# Patient Record
Sex: Male | Born: 1940 | Race: White | Hispanic: No | Marital: Married | Smoking: Never smoker
Health system: Southern US, Community
[De-identification: ages and names within clinical notes are randomized; demographics above are authoritative.]

## PROBLEM LIST (undated history)

## (undated) DIAGNOSIS — Z8719 Personal history of other diseases of the digestive system: Secondary | ICD-10-CM

## (undated) DIAGNOSIS — I10 Essential (primary) hypertension: Secondary | ICD-10-CM

## (undated) DIAGNOSIS — M533 Sacrococcygeal disorders, not elsewhere classified: Secondary | ICD-10-CM

## (undated) DIAGNOSIS — Z9889 Other specified postprocedural states: Secondary | ICD-10-CM

## (undated) DIAGNOSIS — E7800 Pure hypercholesterolemia, unspecified: Secondary | ICD-10-CM

## (undated) DIAGNOSIS — M1990 Unspecified osteoarthritis, unspecified site: Secondary | ICD-10-CM

## (undated) DIAGNOSIS — E079 Disorder of thyroid, unspecified: Secondary | ICD-10-CM

## (undated) DIAGNOSIS — N289 Disorder of kidney and ureter, unspecified: Secondary | ICD-10-CM

## (undated) DIAGNOSIS — I493 Ventricular premature depolarization: Secondary | ICD-10-CM

## (undated) DIAGNOSIS — E039 Hypothyroidism, unspecified: Secondary | ICD-10-CM

## (undated) DIAGNOSIS — Z951 Presence of aortocoronary bypass graft: Secondary | ICD-10-CM

## (undated) DIAGNOSIS — K219 Gastro-esophageal reflux disease without esophagitis: Secondary | ICD-10-CM

## (undated) DIAGNOSIS — L570 Actinic keratosis: Secondary | ICD-10-CM

## (undated) HISTORY — PX: CARDIAC SURGERY: SHX584

## (undated) HISTORY — PX: COLON SURGERY: SHX602

## (undated) HISTORY — DX: Sacrococcygeal disorders, not elsewhere classified: M53.3

## (undated) HISTORY — PX: CORONARY ARTERY BYPASS GRAFT: SHX141

## (undated) HISTORY — DX: Actinic keratosis: L57.0

## (undated) HISTORY — PX: APPENDECTOMY: SHX54

## (undated) HISTORY — PX: PROSTATE SURGERY: SHX751

---

## 2004-02-28 ENCOUNTER — Encounter: Payer: Self-pay | Admitting: Internal Medicine

## 2004-03-30 ENCOUNTER — Encounter: Payer: Self-pay | Admitting: Internal Medicine

## 2004-04-29 ENCOUNTER — Encounter: Payer: Self-pay | Admitting: Internal Medicine

## 2004-05-30 ENCOUNTER — Encounter: Payer: Self-pay | Admitting: Internal Medicine

## 2004-06-30 ENCOUNTER — Encounter: Payer: Self-pay | Admitting: Internal Medicine

## 2004-07-28 ENCOUNTER — Encounter: Payer: Self-pay | Admitting: Internal Medicine

## 2004-08-28 ENCOUNTER — Encounter: Payer: Self-pay | Admitting: Internal Medicine

## 2004-09-27 ENCOUNTER — Encounter: Payer: Self-pay | Admitting: Internal Medicine

## 2004-10-28 ENCOUNTER — Encounter: Payer: Self-pay | Admitting: Internal Medicine

## 2004-11-27 ENCOUNTER — Encounter: Payer: Self-pay | Admitting: Internal Medicine

## 2004-12-28 ENCOUNTER — Encounter: Payer: Self-pay | Admitting: Internal Medicine

## 2005-01-28 ENCOUNTER — Encounter: Payer: Self-pay | Admitting: Internal Medicine

## 2005-02-27 ENCOUNTER — Encounter: Payer: Self-pay | Admitting: Internal Medicine

## 2005-03-30 ENCOUNTER — Encounter: Payer: Self-pay | Admitting: Internal Medicine

## 2005-04-29 ENCOUNTER — Encounter: Payer: Self-pay | Admitting: Internal Medicine

## 2005-05-30 ENCOUNTER — Encounter: Payer: Self-pay | Admitting: Internal Medicine

## 2005-06-30 ENCOUNTER — Encounter: Payer: Self-pay | Admitting: Internal Medicine

## 2005-07-28 ENCOUNTER — Encounter: Payer: Self-pay | Admitting: Internal Medicine

## 2005-08-28 ENCOUNTER — Encounter: Payer: Self-pay | Admitting: Internal Medicine

## 2005-09-27 ENCOUNTER — Encounter: Payer: Self-pay | Admitting: Internal Medicine

## 2005-10-28 ENCOUNTER — Encounter: Payer: Self-pay | Admitting: Internal Medicine

## 2005-11-27 ENCOUNTER — Encounter: Payer: Self-pay | Admitting: Internal Medicine

## 2005-12-28 ENCOUNTER — Encounter: Payer: Self-pay | Admitting: Internal Medicine

## 2006-01-28 ENCOUNTER — Encounter: Payer: Self-pay | Admitting: Internal Medicine

## 2006-02-27 ENCOUNTER — Encounter: Payer: Self-pay | Admitting: Internal Medicine

## 2006-03-30 ENCOUNTER — Encounter: Payer: Self-pay | Admitting: Internal Medicine

## 2006-04-29 ENCOUNTER — Encounter: Payer: Self-pay | Admitting: Internal Medicine

## 2006-05-05 ENCOUNTER — Ambulatory Visit: Payer: Self-pay | Admitting: Internal Medicine

## 2006-05-30 ENCOUNTER — Encounter: Payer: Self-pay | Admitting: Internal Medicine

## 2006-06-30 ENCOUNTER — Encounter: Payer: Self-pay | Admitting: Internal Medicine

## 2006-07-29 ENCOUNTER — Encounter: Payer: Self-pay | Admitting: Internal Medicine

## 2006-08-29 ENCOUNTER — Encounter: Payer: Self-pay | Admitting: Internal Medicine

## 2006-09-28 ENCOUNTER — Encounter: Payer: Self-pay | Admitting: Internal Medicine

## 2006-10-18 ENCOUNTER — Other Ambulatory Visit: Payer: Self-pay

## 2006-10-18 ENCOUNTER — Inpatient Hospital Stay: Payer: Self-pay | Admitting: Internal Medicine

## 2006-10-29 ENCOUNTER — Encounter: Payer: Self-pay | Admitting: Internal Medicine

## 2006-11-28 ENCOUNTER — Encounter: Payer: Self-pay | Admitting: Internal Medicine

## 2010-05-13 ENCOUNTER — Observation Stay: Payer: Self-pay | Admitting: Family Medicine

## 2012-03-27 ENCOUNTER — Ambulatory Visit: Payer: Self-pay | Admitting: Unknown Physician Specialty

## 2012-03-28 LAB — PATHOLOGY REPORT

## 2013-01-26 ENCOUNTER — Emergency Department: Payer: Self-pay | Admitting: Emergency Medicine

## 2013-01-26 LAB — URINALYSIS, COMPLETE
Bacteria: NONE SEEN
Bilirubin,UR: NEGATIVE
Blood: NEGATIVE
Glucose,UR: NEGATIVE mg/dL
Ketone: NEGATIVE
Leukocyte Esterase: NEGATIVE
Nitrite: NEGATIVE
Ph: 7
Protein: NEGATIVE
RBC,UR: <1 /HPF
Specific Gravity: 1.019
Squamous Epithelial: NONE SEEN
WBC UR: 2 /HPF

## 2013-01-26 LAB — CBC
HCT: 39.7 % — ABNORMAL LOW
HGB: 13.8 g/dL
MCH: 32.4 pg
MCHC: 34.7 g/dL
MCV: 93 fL
Platelet: 195 x10 3/mm 3
RBC: 4.26 x10 6/mm 3 — ABNORMAL LOW
RDW: 13.4 %
WBC: 10.4 x10 3/mm 3

## 2013-01-26 LAB — TROPONIN I: Troponin-I: <0.02 ng/mL

## 2013-01-26 LAB — COMPREHENSIVE METABOLIC PANEL
Alkaline Phosphatase: 78 U/L (ref 50–136)
Anion Gap: 6 — ABNORMAL LOW (ref 7–16)
BUN: 30 mg/dL — ABNORMAL HIGH (ref 7–18)
Calcium, Total: 9.3 mg/dL (ref 8.5–10.1)
Chloride: 103 mmol/L (ref 98–107)
Creatinine: 1.52 mg/dL — ABNORMAL HIGH (ref 0.60–1.30)
EGFR (African American): 53 — ABNORMAL LOW
Osmolality: 276 (ref 275–301)
SGOT(AST): 24 U/L (ref 15–37)
SGPT (ALT): 32 U/L (ref 12–78)

## 2013-05-30 DIAGNOSIS — C801 Malignant (primary) neoplasm, unspecified: Secondary | ICD-10-CM

## 2013-05-30 HISTORY — PX: HERNIA REPAIR: SHX51

## 2013-05-30 HISTORY — DX: Malignant (primary) neoplasm, unspecified: C80.1

## 2013-07-17 ENCOUNTER — Ambulatory Visit: Payer: Self-pay | Admitting: Surgery

## 2013-07-23 ENCOUNTER — Ambulatory Visit: Payer: Self-pay | Admitting: Surgery

## 2013-10-11 DIAGNOSIS — E039 Hypothyroidism, unspecified: Secondary | ICD-10-CM | POA: Insufficient documentation

## 2013-10-11 DIAGNOSIS — C61 Malignant neoplasm of prostate: Secondary | ICD-10-CM | POA: Insufficient documentation

## 2013-10-11 DIAGNOSIS — I2510 Atherosclerotic heart disease of native coronary artery without angina pectoris: Secondary | ICD-10-CM | POA: Insufficient documentation

## 2013-10-16 ENCOUNTER — Ambulatory Visit: Payer: Self-pay | Admitting: Internal Medicine

## 2013-10-28 ENCOUNTER — Ambulatory Visit: Payer: Self-pay | Admitting: Internal Medicine

## 2013-10-28 HISTORY — PX: PROSTATE SURGERY: SHX751

## 2014-03-21 DIAGNOSIS — I340 Nonrheumatic mitral (valve) insufficiency: Secondary | ICD-10-CM | POA: Insufficient documentation

## 2014-03-28 DIAGNOSIS — R739 Hyperglycemia, unspecified: Secondary | ICD-10-CM | POA: Insufficient documentation

## 2014-04-23 ENCOUNTER — Emergency Department: Payer: Self-pay | Admitting: Emergency Medicine

## 2014-05-05 ENCOUNTER — Ambulatory Visit: Payer: Self-pay | Admitting: Internal Medicine

## 2014-09-20 NOTE — Op Note (Signed)
PATIENT NAME:  Douglas Phelps, Douglas Phelps MR#:  [id number] DATE OF BIRTH:  [birth year]  DATE OF PROCEDURE:  07/23/2013  PREOPERATIVE DIAGNOSIS: Right inguinal hernia.   POSTOPERATIVE DIAGNOSIS:  Right inguinal hernia.  PROCEDURE: Right inguinal hernia repair.   SURGEON: Rochel Brome, M.D.   ANESTHESIA: General.   INDICATIONS: This 74 year old male has had bulging in the right groin. A hernia was demonstrated on physical exam and repair was recommended for definitive treatment.   DESCRIPTION OF PROCEDURE: The patient was placed on the operating table in the supine position under general anesthesia. The left lower quadrant and groin were prepared with clippers and with ChloraPrep, draped in a sterile manner. A right lower quadrant transversely oriented suprapubic incision was made, carried down through subcutaneous tissues. Several traversing small vessels were cauterized and divided. The Scarpa's fascia was incised. The external ring was identified. The external oblique aponeurosis was incised along the course of its fibers to open the external ring and expose the inguinal cord structures. The cord structures were mobilized along with the ilioinguinal nerve. Cremaster fibers were spread. There was no indirect sac; however, there was a large direct inguinal hernia and the defect extended from the pubic tubercle to the internal ring. A circumferential incision was made in the attenuated transversalis fascia, which was resected. Next, the sac, which was approximately 4 cm in length, was inverted. Next, a repair was carried out with a row of 0 Surgilon sutures, suturing the shelving edge of the inguinal ligament to the conjoined tendon, incorporating transversalis fascia into the repair. The last stitch led to satisfactory narrowing of the internal ring. Next, a relaxing incision was made medially in the internal oblique fascia. Next, an onlay Bard soft mesh was cut to create an oval shape of some 2.8 x 3.5 cm and  with a notch cut out to straddle the internal ring. This was placed along the floor of the inguinal canal and was sutured laterally to the repair with 0 Surgilon, and also was sutured medially to the medial aspect of the relaxing incision and also sutures were used to hold the mesh adjacent to the internal ring. The repair looked good. Hemostasis was intact. The ilioinguinal nerve and cord structures were replaced along the floor of the inguinal canal. Cut edges of the external oblique aponeurosis were approximated with running 4-0 Vicryl. The deep fascia superior and lateral to the repair site was infiltrated with 0.5% Sensorcaine with epinephrine. Subcutaneous tissues were infiltrated as well for a total of 20 mL. Next, the Scarpa's fascia was closed with interrupted 4-0 Vicryl, and the skin was closed with running 4-0 Monocryl subcuticular suture and Dermabond. The testicle remained in the scrotum. The patient tolerated the procedure satisfactorily and was then prepared for transfer to the recovery room.     ____________________________ Lenna Sciara. Rochel Brome, MD jws:dmm D: 07/23/2013 09:22:28 ET T: 07/23/2013 10:13:15 ET JOB#: [id number]  cc: Loreli Dollar, MD, <Dictator> Loreli Dollar MD ELECTRONICALLY SIGNED 07/24/2013 16:16

## 2015-07-30 DIAGNOSIS — N183 Chronic kidney disease, stage 3 unspecified: Secondary | ICD-10-CM | POA: Insufficient documentation

## 2015-11-18 ENCOUNTER — Other Ambulatory Visit: Payer: Self-pay | Admitting: Internal Medicine

## 2015-11-18 DIAGNOSIS — N179 Acute kidney failure, unspecified: Secondary | ICD-10-CM

## 2015-11-26 ENCOUNTER — Ambulatory Visit
Admission: RE | Admit: 2015-11-26 | Discharge: 2015-11-26 | Disposition: A | Discharge status: Home / Self Care | Payer: Medicare Other | Source: Ambulatory Visit | Attending: Internal Medicine | Admitting: Internal Medicine

## 2015-11-26 DIAGNOSIS — N2889 Other specified disorders of kidney and ureter: Secondary | ICD-10-CM | POA: Insufficient documentation

## 2015-11-26 DIAGNOSIS — N179 Acute kidney failure, unspecified: Secondary | ICD-10-CM

## 2016-02-06 ENCOUNTER — Emergency Department: Payer: Medicare Other

## 2016-02-06 ENCOUNTER — Emergency Department
Admission: EM | Admit: 2016-02-06 | Discharge: 2016-02-06 | Disposition: A | Discharge status: Home / Self Care | Payer: Medicare Other | Attending: Emergency Medicine | Admitting: Emergency Medicine

## 2016-02-06 ENCOUNTER — Encounter: Payer: Self-pay | Admitting: Emergency Medicine

## 2016-02-06 DIAGNOSIS — R000 Tachycardia, unspecified: Secondary | ICD-10-CM | POA: Diagnosis not present

## 2016-02-06 DIAGNOSIS — R079 Chest pain, unspecified: Secondary | ICD-10-CM

## 2016-02-06 DIAGNOSIS — R002 Palpitations: Secondary | ICD-10-CM

## 2016-02-06 DIAGNOSIS — E871 Hypo-osmolality and hyponatremia: Secondary | ICD-10-CM | POA: Diagnosis not present

## 2016-02-06 DIAGNOSIS — I10 Essential (primary) hypertension: Secondary | ICD-10-CM | POA: Diagnosis not present

## 2016-02-06 HISTORY — DX: Disorder of thyroid, unspecified: E07.9

## 2016-02-06 HISTORY — DX: Disorder of kidney and ureter, unspecified: N28.9

## 2016-02-06 HISTORY — DX: Presence of aortocoronary bypass graft: Z95.1

## 2016-02-06 HISTORY — DX: Pure hypercholesterolemia, unspecified: E78.00

## 2016-02-06 HISTORY — DX: Essential (primary) hypertension: I10

## 2016-02-06 HISTORY — DX: Gastro-esophageal reflux disease without esophagitis: K21.9

## 2016-02-06 LAB — BASIC METABOLIC PANEL
ANION GAP: 8 (ref 5–15)
BUN: 17 mg/dL (ref 6–20)
CALCIUM: 9.2 mg/dL (ref 8.9–10.3)
CHLORIDE: 100 mmol/L — AB (ref 101–111)
CO2: 23 mmol/L (ref 22–32)
Creatinine, Ser: 1.36 mg/dL — ABNORMAL HIGH (ref 0.61–1.24)
GFR calc non Af Amer: 50 mL/min — ABNORMAL LOW (ref >60)
GFR, EST AFRICAN AMERICAN: 58 mL/min — AB (ref >60)
Glucose, Bld: 158 mg/dL — ABNORMAL HIGH (ref 65–99)
Potassium: 4.4 mmol/L (ref 3.5–5.1)
Sodium: 131 mmol/L — ABNORMAL LOW (ref 135–145)

## 2016-02-06 LAB — CBC
HCT: 42.5 % (ref 40.0–52.0)
HEMOGLOBIN: 15 g/dL (ref 13.0–18.0)
MCH: 33.1 pg (ref 26.0–34.0)
MCHC: 35.4 g/dL (ref 32.0–36.0)
MCV: 93.6 fL (ref 80.0–100.0)
Platelets: 194 10*3/uL (ref 150–440)
RBC: 4.54 MIL/uL (ref 4.40–5.90)
RDW: 12.9 % (ref 11.5–14.5)
WBC: 7.9 10*3/uL (ref 3.8–10.6)

## 2016-02-06 LAB — TROPONIN I: Troponin I: <0.03 ng/mL (ref <0.03)

## 2016-02-06 MED ORDER — ISOSORBIDE MONONITRATE ER 30 MG PO TB24: 30.0000 mg | ORAL_TABLET | Freq: Every day | ORAL | 0 refills | Status: DC

## 2016-02-06 MED ORDER — ASPIRIN EC 325 MG PO TBEC
325.0000 mg | DELAYED_RELEASE_TABLET | Freq: Once | ORAL | Status: AC
  Administered 2016-02-06: 325 mg via ORAL
  Filled 2016-02-06: qty 1

## 2016-02-06 NOTE — ED Provider Notes (Addendum)
[facility] Emergency Department Provider Note  ____________________________________________  Time seen: Approximately 3:49 PM  I have reviewed the triage vital signs and the nursing notes.   HISTORY  Chief Complaint Chest Pain    HPI Douglas Phelps is a 75 y.o. male w/ a hx of CAD s/p CABG remotely, HTN, HL, CKI, GERD, presenting with chest pain. The patient reports that for the past month he has had several episodes weekly of the chest "discomfort" that is central and dull. It is not associated with shortness of breath, back pain, lightheadedness or syncope, palpitations. It is worse when he moves his shoulders around and has lateral positional changes, but he denies any recent changes in his daily activities. It is not associated with exertion, and he regularly exercises with heart rates in the 110s to 120s without chest discomfort. It is not associated with food. Last week, he went for routine examination with his cardiologist and discuss his chest pain, no stress test is scheduled for 02/18/16. Today, the patient had just finished eating lunch at [employer] he was walking out to the garden when he developed the same chest discomfort he has been having, but this time it was also associated with a "foggy" feeling and a "pounding" of his chest. The symptoms lasted for longer than usual, but have almost completely resolved at this time.  The only 2 things that are notable recently, so the patient has increased his water intake significantly due to his CK ED and is drinking at least 96 ounces of water daily, and then he recently was switched from Zocor to Lipitor taking his first dose last night.   Past Medical History:  Diagnosis Date  . GERD (gastroesophageal reflux disease)   . High cholesterol   . Hx of CABG   . Hypertension   . Renal disorder   . Thyroid disease     There are no active problems to display for this patient.   History reviewed. No pertinent  surgical history.    Allergies Benadryl [diphenhydramine hcl (sleep)] and Lotensin [benazepril]  History reviewed. No pertinent family history.  Social History Social History  Substance Use Topics  . Smoking status: Never Smoker  . Smokeless tobacco: Never Used  . Alcohol use No    Review of Systems Constitutional: No fever/chills.No lightheadedness or syncope. No diaphoresis. Eyes: No visual changes. ENT: No sore throat. No congestion or rhinorrhea. Cardiovascular: Positive chest pain. Denies palpitations. Respiratory: Denies shortness of breath.  No cough. Gastrointestinal: No abdominal pain.  No nausea, no vomiting.  No diarrhea.  No constipation. Genitourinary: Negative for dysuria. Musculoskeletal: Negative for back pain. Skin: Negative for rash. Neurological: Negative for headaches. No focal numbness, tingling or weakness.   10-point ROS otherwise negative.  ____________________________________________   PHYSICAL EXAM:  VITAL SIGNS: ED Triage Vitals  Enc Vitals Group     BP 02/06/16 1355 (!) 183/54     Pulse Rate 02/06/16 1355 79     Resp 02/06/16 1355 18     Temp 02/06/16 1355 98.5 F (36.9 C)     Temp Source 02/06/16 1355 Oral     SpO2 02/06/16 1355 100 %     Weight 02/06/16 1356 167 lb (75.8 kg)     Height 02/06/16 1356 5\' 6"  (1.676 m)     Head Circumference --      Peak Flow --      Pain Score 02/06/16 1356 1     Pain Loc --  Pain Edu? --      Excl. in [place]? --     Constitutional: Alert and oriented. Well appearing and in no acute distress. Answers questions appropriately. Eyes: Conjunctivae are normal.  EOMI. No scleral icterus. Head: Atraumatic. Nose: No congestion/rhinnorhea. Mouth/Throat: Mucous membranes are moist.  Neck: No stridor.  Supple.   Cardiovascular: Normal rate, regular rhythm. No murmurs, rubs or gallops.  Respiratory: Normal respiratory effort.  No accessory muscle use or retractions. Lungs CTAB.  No wheezes, rales or  ronchi. Gastrointestinal: Soft, nontender and nondistended.  No guarding or rebound.  No peritoneal signs. Musculoskeletal: No LE edema. No ttp in the calves or palpable cords.  Negative Homan's sign. Neurologic:  A&Ox3.  Speech is clear.  Face and smile are symmetric.  EOMI.  Moves all extremities well. Skin:  Skin is warm, dry and intact. No rash noted. Psychiatric: Mood and affect are normal. Speech and behavior are normal.  Normal judgement.  ____________________________________________   LABS (all labs ordered are listed, but only abnormal results are displayed)  Labs Reviewed  BASIC METABOLIC PANEL - Abnormal; Notable for the following:       Result Value   Sodium 131 (*)    Chloride 100 (*)    Glucose, Bld 158 (*)    Creatinine, Ser 1.36 (*)    GFR calc non Af Amer 50 (*)    GFR calc Af Amer 58 (*)    All other components within normal limits  CBC  TROPONIN I  TROPONIN I   ____________________________________________  EKG  ED ECG REPORT I, Eula Listen, the attending physician, personally viewed and interpreted this ECG.   Date: 02/06/2016  EKG Time: 1354  Rate: 76  Rhythm: normal sinus rhythm  Axis: Normal  Intervals:none  ST&T Change: Nonspecific T-wave inversion in V1. No STEMI  ____________________________________________  RADIOLOGY  Dg Chest 2 View  Result Date: 02/06/2016 CLINICAL DATA:  Patient with chest pain. EXAM: CHEST  2 VIEW COMPARISON:  Chest radiograph 05/13/2010 FINDINGS: Normal cardiac and mediastinal contours status post median sternotomy. No consolidative pulmonary opacities. No pleural effusion or pneumothorax. Biapical pleural parenchymal thickening. Aortic vascular calcifications. Mid thoracic spine degenerative changes. IMPRESSION: No acute cardiopulmonary process. Aortic vascular calcifications. Electronically Signed   By: Lovey Newcomer M.D.   On: 02/06/2016 14:23     ____________________________________________   PROCEDURES  Procedure(s) performed: None  Procedures  Critical Care performed: No ____________________________________________   INITIAL IMPRESSION / ASSESSMENT AND PLAN / ED COURSE  Pertinent labs & imaging results that were available during my care of the patient were reviewed by me and considered in my medical decision making (see chart for details).  75 y.o. male with a history of CAD status post CABG presenting with 1 month of intermittent chest pain, today is associated with foggy headed sensation and a chest pounding. At this time, the patient has stable vital signs and a reassuring cardiopulmonary examination. His workup in the emergency department does not show any ischemic changes on his EKG, chest x-ray with no acute cardio pulmonary process, and a negative troponin. His troponin was drawn within 2 hours of his chest pain episode, also get a second draw at 5 PM. I do not see any clinical evidence that would be suggestive of PE, there is no history or physical evidence for aortic pathology. He has no infectious process today. GI causes, including esophageal spasm or GERD, are possible. If his workup in the emergency department is negative, we will plan to  talk to his cardiologist, Dr. Nehemiah Massed, to see fluid be able to move up his stress test.  ----------------------------------------- 6:01 PM on 02/06/2016 -----------------------------------------  The patient has a second troponin which is negative, and a second EKG which is grossly unchanged.  ED ECG REPORT I, Eula Listen, the attending physician, personally viewed and interpreted this ECG.   Date: 02/06/2016  EKG Time: 1621  Rate: 61  Rhythm: normal sinus rhythm  Axis: normal  Intervals:none  ST&T Change: Nonspecific T-wave inversions in V1. No ST elevation.  Unchanged from previous.   I've spoken with the patient's cardiologist, Dr. Nehemiah Massed, who  recommends starting a nitrate, Imdur 30 mg daily which will help him make a recommendation about cardio catheterization. In the meantime, I will plan to discharge the patient home, and have him record his symptoms. He understands return precautions as well as follow-up instructions. ____________________________________________  FINAL CLINICAL IMPRESSION(S) / ED DIAGNOSES  Final diagnoses:  Nonspecific chest pain  Hyponatremia  Pounding heartbeat       NEW MEDICATIONS STARTED DURING THIS VISIT:  New Prescriptions   ISOSORBIDE MONONITRATE (IMDUR) 30 MG 24 HR TABLET    Take 1 tablet (30 mg total) by mouth daily.      Eula Listen, MD 02/06/16 [id number]    Eula Listen, MD 02/06/16 [id number]

## 2016-02-06 NOTE — Discharge Instructions (Addendum)
Please start taking Imdur daily, and record your symptoms which may help Dr. Gigi Gin make further medical decisions.  Return to the emergency department if you develop worsening or severe chest pain, shortness of breath, lightheadedness or fainting, palpitations, fever, or any other symptoms concerning to you.

## 2016-02-06 NOTE — ED Triage Notes (Signed)
Pt to ed with c/o chest pain this am.  Pt states was seen by cardiologist 2 days ago for same.  Pt states scheduled for stress test.  Pt denies sob.  Denies n/v.

## 2016-03-24 ENCOUNTER — Other Ambulatory Visit: Payer: Self-pay | Admitting: Internal Medicine

## 2016-03-24 DIAGNOSIS — R0789 Other chest pain: Secondary | ICD-10-CM

## 2016-03-29 ENCOUNTER — Ambulatory Visit
Admission: RE | Admit: 2016-03-29 | Discharge: 2016-03-29 | Disposition: A | Discharge status: Home / Self Care | Payer: Medicare Other | Source: Ambulatory Visit | Attending: Internal Medicine | Admitting: Internal Medicine

## 2016-03-29 DIAGNOSIS — K449 Diaphragmatic hernia without obstruction or gangrene: Secondary | ICD-10-CM | POA: Diagnosis not present

## 2016-03-29 DIAGNOSIS — K219 Gastro-esophageal reflux disease without esophagitis: Secondary | ICD-10-CM | POA: Diagnosis not present

## 2016-03-29 DIAGNOSIS — R0789 Other chest pain: Secondary | ICD-10-CM | POA: Diagnosis present

## 2016-05-03 DIAGNOSIS — R933 Abnormal findings on diagnostic imaging of other parts of digestive tract: Secondary | ICD-10-CM | POA: Insufficient documentation

## 2016-05-03 DIAGNOSIS — K219 Gastro-esophageal reflux disease without esophagitis: Secondary | ICD-10-CM | POA: Insufficient documentation

## 2016-06-20 ENCOUNTER — Encounter: Payer: Self-pay | Admitting: Emergency Medicine

## 2016-06-20 ENCOUNTER — Emergency Department
Admission: EM | Admit: 2016-06-20 | Discharge: 2016-06-20 | Disposition: A | Discharge status: Home / Self Care | Payer: Medicare Other | Attending: Emergency Medicine | Admitting: Emergency Medicine

## 2016-06-20 DIAGNOSIS — I10 Essential (primary) hypertension: Secondary | ICD-10-CM | POA: Diagnosis not present

## 2016-06-20 DIAGNOSIS — R42 Dizziness and giddiness: Secondary | ICD-10-CM | POA: Insufficient documentation

## 2016-06-20 LAB — BASIC METABOLIC PANEL
ANION GAP: 8 (ref 5–15)
BUN: 23 mg/dL — ABNORMAL HIGH (ref 6–20)
CALCIUM: 9.6 mg/dL (ref 8.9–10.3)
CHLORIDE: 92 mmol/L — AB (ref 101–111)
CO2: 25 mmol/L (ref 22–32)
Creatinine, Ser: 1.38 mg/dL — ABNORMAL HIGH (ref 0.61–1.24)
GFR calc non Af Amer: 49 mL/min — ABNORMAL LOW (ref >60)
GFR, EST AFRICAN AMERICAN: 57 mL/min — AB (ref >60)
Glucose, Bld: 105 mg/dL — ABNORMAL HIGH (ref 65–99)
Potassium: 4.7 mmol/L (ref 3.5–5.1)
SODIUM: 125 mmol/L — AB (ref 135–145)

## 2016-06-20 LAB — HEPATIC FUNCTION PANEL
ALBUMIN: 4.9 g/dL (ref 3.5–5.0)
ALT: 20 U/L (ref 17–63)
AST: 27 U/L (ref 15–41)
Alkaline Phosphatase: 57 U/L (ref 38–126)
Bilirubin, Direct: <0.1 mg/dL — ABNORMAL LOW (ref 0.1–0.5)
Total Bilirubin: 0.6 mg/dL (ref 0.3–1.2)
Total Protein: 8 g/dL (ref 6.5–8.1)

## 2016-06-20 LAB — CBC
HCT: 43 % (ref 40.0–52.0)
HEMOGLOBIN: 14.9 g/dL (ref 13.0–18.0)
MCH: 31.6 pg (ref 26.0–34.0)
MCHC: 34.6 g/dL (ref 32.0–36.0)
MCV: 91.4 fL (ref 80.0–100.0)
Platelets: 211 10*3/uL (ref 150–440)
RBC: 4.71 MIL/uL (ref 4.40–5.90)
RDW: 12.8 % (ref 11.5–14.5)
WBC: 8.6 10*3/uL (ref 3.8–10.6)

## 2016-06-20 LAB — TROPONIN I

## 2016-06-20 MED ORDER — ONDANSETRON HCL 4 MG/2ML IJ SOLN
4.0000 mg | Freq: Once | INTRAMUSCULAR | Status: AC
  Administered 2016-06-20: 4 mg via INTRAVENOUS
  Filled 2016-06-20: qty 2

## 2016-06-20 MED ORDER — ACETAMINOPHEN 325 MG PO TABS
ORAL_TABLET | ORAL | Status: AC
  Administered 2016-06-20: 650 mg via ORAL
  Filled 2016-06-20: qty 2

## 2016-06-20 MED ORDER — CLONIDINE HCL 0.1 MG PO TABS: 0.1000 mg | ORAL_TABLET | Freq: Two times a day (BID) | ORAL | 0 refills | Status: DC | PRN

## 2016-06-20 MED ORDER — HYDROMORPHONE HCL 1 MG/ML IJ SOLN
0.5000 mg | Freq: Once | INTRAMUSCULAR | Status: AC
  Administered 2016-06-20: 0.5 mg via INTRAVENOUS
  Filled 2016-06-20: qty 1

## 2016-06-20 MED ORDER — SODIUM CHLORIDE 0.9 % IV BOLUS (SEPSIS)
500.0000 mL | Freq: Once | INTRAVENOUS | Status: AC
  Administered 2016-06-20: 500 mL via INTRAVENOUS

## 2016-06-20 MED ORDER — ACETAMINOPHEN 325 MG PO TABS
650.0000 mg | ORAL_TABLET | ORAL | Status: AC
  Administered 2016-06-20: 650 mg via ORAL

## 2016-06-20 NOTE — ED Notes (Signed)
Pt states feeling dizzy, jittery, and headache, states his BP has been elevated of recent and states he was started on HCTZ 25 mg last week, at present pt awake and alert in no acute distress, wife at bedside

## 2016-06-20 NOTE — ED Provider Notes (Signed)
Time Seen: Approximately 1532 I have reviewed the triage notes  Chief Complaint: Dizziness   History of Present Illness: Douglas Phelps is a 76 y.o. male *who states he's had some issues with labile blood pressure at home. Patient had contacted the on call physician and explained his blood pressure was running high at home and he was prescribed hydrochlorothiazide on Saturday. The patient states he's been on the medication and was going to follow up with his primary physician and no due to the weather his appointment was delayed to this Wednesday. He states he's been experiencing some intermittent feelings of lightheadedness headaches and feeling "" jittery "". He denies any focal weakness and states he tried exercise today just didn't feel well. Diffuse crampiness in his lower extremities and up also into room the abdomen. Denies any chest pain or shortness of breath. He denies any trouble with speech or swallowing. Headache he states he's had before in the past when his blood pressure becomes labile describes it as a posterior headache that comes and goes. Denies any photophobia, loss of vision, neck pain or stiffness. No fever or recent head trauma at home.   Past Medical History:  Diagnosis Date  . GERD (gastroesophageal reflux disease)   . High cholesterol   . Hx of CABG   . Hypertension   . Renal disorder   . Thyroid disease     There are no active problems to display for this patient.   Past Surgical History:  Procedure Laterality Date  . CARDIAC SURGERY     CABG x 3  06/2000    Past Surgical History:  Procedure Laterality Date  . CARDIAC SURGERY     CABG x 3  06/2000    Current Outpatient Rx  . Order #: [id number] Class: Print  . Order #: [id number] Class: Print    Allergies:  Benadryl [diphenhydramine hcl (sleep)]; Lotensin [benazepril]; and Prilosec [omeprazole]  Family History: No family history on file.  Social History: Social History  Substance Use Topics   . Smoking status: Never Smoker  . Smokeless tobacco: Never Used  . Alcohol use No     Review of Systems:   10 point review of systems was performed and was otherwise negative:  Constitutional: No fever Eyes: No visual disturbances ENT: No sore throat, ear pain Cardiac: No chest pain Respiratory: No shortness of breath, wheezing, or stridor Abdomen: He denies any current abdominal pain to this historian. No associated nausea, vomiting or diarrhea. Endocrine: No weight loss, No night sweats Extremities: No peripheral edema, cyanosis Skin: No rashes, easy bruising Neurologic: No focal weakness, trouble with speech or swollowing Urologic: No dysuria, Hematuria, or urinary frequency   Physical Exam:  ED Triage Vitals  Enc Vitals Group     BP 06/20/16 1153 (!) 156/72     Pulse Rate 06/20/16 1153 72     Resp 06/20/16 1153 16     Temp 06/20/16 1153 98 F (36.7 C)     Temp Source 06/20/16 1153 Oral     SpO2 06/20/16 1153 98 %     Weight 06/20/16 1153 166 lb (75.3 kg)     Height 06/20/16 1153 5\' 6"  (1.676 m)     Head Circumference --      Peak Flow --      Pain Score 06/20/16 1159 0     Pain Loc --      Pain Edu? --      Excl. in [place]? --  General: Awake , Alert , and Oriented times 3; GCS 15 Head: Normal cephalic , atraumatic. No reproducible nature to the headache Eyes: Pupils equal , round, reactive to light Nose/Throat: No nasal drainage, patent upper airway without erythema or exudate.  Neck: Supple, Full range of motion, No anterior adenopathy or palpable thyroid masses. No meningeal signs Lungs: Clear to ascultation without wheezes , rhonchi, or rales Heart: Regular rate, regular rhythm without murmurs , gallops , or rubs Abdomen: Soft, non tender without rebound, guarding , or rigidity; bowel sounds positive and symmetric in all 4 quadrants. No organomegaly .        Extremities: 2 plus symmetric pulses. No edema, clubbing or cyanosis Neurologic: normal  ambulation, Motor symmetric without deficits, sensory intact Skin: warm, dry, no rashes   Labs:   All laboratory work was reviewed including any pertinent negatives or positives listed below:  Labs Reviewed  BASIC METABOLIC PANEL - Abnormal; Notable for the following:       Result Value   Sodium 125 (*)    Chloride 92 (*)    Glucose, Bld 105 (*)    BUN 23 (*)    Creatinine, Ser 1.38 (*)    GFR calc non Af Amer 49 (*)    GFR calc Af Amer 57 (*)    All other components within normal limits  HEPATIC FUNCTION PANEL - Abnormal; Notable for the following:    Bilirubin, Direct <0.1 (*)    All other components within normal limits  CBC  TROPONIN I  CBG MONITORING, ED    EKG: ED ECG REPORT I, Daymon Larsen, the attending physician, personally viewed and interpreted this ECG.  Date: 06/20/2016 EKG Time: 1222 Rate: 64 Rhythm: normal sinus rhythm QRS Axis: normal Intervals: Incomplete right bundle branch block ST/T Wave abnormalities: normal Conduction Disturbances: none Narrative Interpretation: unremarkable Left atrial enlargement No acute ischemic changes    ED Course: * Patient's stay here was uneventful and he was given a 500 mL bolus of normal saline. I felt he did not require head CT evaluation and investigation of his headaches and has had similar headaches before in the past. He has no focal neurologic deficits and no meningeal signs, etc. The patient was advised to stop the hydrochlorothiazide and presents with some hyponatremia. He has an appointment upcoming with his primary physician was given copies of his labs for home usage. He has a history of renal insufficiency. The patient was advised drink plenty of fluids and increase the salt in his diet. I felt his symptoms were exclusively to hyponatremia but could explain his feelings of lightheadedness along with his cramps etc. Patient was prescribed clonidine for as needed and hypertension advised continue with all of  his other medications and allow Dr. Doy Hutching to make adjustments as necessary.     Assessment:  Near syncope Hyponatremia   Final Clinical Impression:  Final diagnoses:  Dizziness     Plan:  Outpatient " New Prescriptions   CLONIDINE (CATAPRES) 0.1 MG TABLET    Take 1 tablet (0.1 mg total) by mouth 2 (two) times daily as needed.  " Patient was advised to return immediately if condition worsens. Patient was advised to follow up with their primary care physician or other specialized physicians involved in their outpatient care. The patient and/or family member/power of attorney had laboratory results reviewed at the bedside. All questions and concerns were addressed and appropriate discharge instructions were distributed by the nursing staff.  Daymon Larsen, MD 06/20/16 [phone]

## 2016-06-20 NOTE — Discharge Instructions (Signed)
Please hold the hydrochlorothiazide and continue with all of your other blood pressure medications. Please only take the clonidine as needed if your systolic pressures get above [id number] diastolic pressures greater than 100. Return to emergency department for chest pain, shortness of breath, increasing headache, weakness, or any other new concerns. Please drink plenty of fluids and increase the salt content for Next couple of days.  Please return immediately if condition worsens. Please contact her primary physician or the physician you were given for referral. If you have any specialist physicians involved in her treatment and plan please also contact them. Thank you for using  regional emergency Department.

## 2016-06-20 NOTE — ED Triage Notes (Signed)
Patient is a patient of Dr. Doy Hutching.  Started on HCTZ 25 mg QD on Saturday as an additional medication for blood pressure.  Patient has had a one week history of elevated BP, 186/84.  Also on Valsaarten, Metoprolol, Amlodipine.  Patient states he has been experiencing intermittent dizziness, headaches, and feeling jittery.  Patient states while at excersise today, just did not feel well and came to ED to be evaluated.  Also c/o pressure to Right upper abdomen under ribs that radiates around to upper back.

## 2016-06-22 ENCOUNTER — Other Ambulatory Visit: Payer: Self-pay | Admitting: Internal Medicine

## 2016-06-22 DIAGNOSIS — N183 Chronic kidney disease, stage 3 unspecified: Secondary | ICD-10-CM

## 2016-07-01 ENCOUNTER — Ambulatory Visit
Admission: RE | Admit: 2016-07-01 | Discharge: 2016-07-01 | Disposition: A | Discharge status: Home / Self Care | Payer: Medicare Other | Source: Ambulatory Visit | Attending: Internal Medicine | Admitting: Internal Medicine

## 2016-07-01 DIAGNOSIS — N183 Chronic kidney disease, stage 3 unspecified: Secondary | ICD-10-CM

## 2016-07-01 DIAGNOSIS — N289 Disorder of kidney and ureter, unspecified: Secondary | ICD-10-CM | POA: Diagnosis not present

## 2017-01-20 ENCOUNTER — Ambulatory Visit (INDEPENDENT_AMBULATORY_CARE_PROVIDER_SITE_OTHER): Payer: Medicare Other | Admitting: Vascular Surgery

## 2017-01-20 ENCOUNTER — Encounter: Payer: Self-pay | Admitting: *Deleted

## 2017-01-20 ENCOUNTER — Emergency Department: Payer: Medicare Other

## 2017-01-20 ENCOUNTER — Encounter (INDEPENDENT_AMBULATORY_CARE_PROVIDER_SITE_OTHER): Payer: Self-pay | Admitting: Vascular Surgery

## 2017-01-20 ENCOUNTER — Emergency Department
Admission: EM | Admit: 2017-01-20 | Discharge: 2017-01-20 | Disposition: A | Discharge status: Home / Self Care | Payer: Medicare Other | Attending: Emergency Medicine | Admitting: Emergency Medicine

## 2017-01-20 VITALS — BP 160/70 | HR 60 | Resp 15 | Ht 66.0 in | Wt 171.0 lb

## 2017-01-20 DIAGNOSIS — Z79899 Other long term (current) drug therapy: Secondary | ICD-10-CM | POA: Diagnosis not present

## 2017-01-20 DIAGNOSIS — Z7982 Long term (current) use of aspirin: Secondary | ICD-10-CM | POA: Diagnosis not present

## 2017-01-20 DIAGNOSIS — Z951 Presence of aortocoronary bypass graft: Secondary | ICD-10-CM | POA: Insufficient documentation

## 2017-01-20 DIAGNOSIS — I10 Essential (primary) hypertension: Secondary | ICD-10-CM | POA: Diagnosis not present

## 2017-01-20 DIAGNOSIS — R0789 Other chest pain: Secondary | ICD-10-CM | POA: Diagnosis not present

## 2017-01-20 DIAGNOSIS — E7800 Pure hypercholesterolemia, unspecified: Secondary | ICD-10-CM

## 2017-01-20 DIAGNOSIS — I6523 Occlusion and stenosis of bilateral carotid arteries: Secondary | ICD-10-CM | POA: Diagnosis not present

## 2017-01-20 DIAGNOSIS — I6529 Occlusion and stenosis of unspecified carotid artery: Secondary | ICD-10-CM | POA: Insufficient documentation

## 2017-01-20 DIAGNOSIS — R079 Chest pain, unspecified: Secondary | ICD-10-CM | POA: Diagnosis present

## 2017-01-20 LAB — BASIC METABOLIC PANEL
Anion gap: 10 (ref 5–15)
BUN: 21 mg/dL — AB (ref 6–20)
CHLORIDE: 102 mmol/L (ref 101–111)
CO2: 21 mmol/L — AB (ref 22–32)
Calcium: 9.1 mg/dL (ref 8.9–10.3)
Creatinine, Ser: 1.4 mg/dL — ABNORMAL HIGH (ref 0.61–1.24)
GFR calc Af Amer: 55 mL/min — ABNORMAL LOW (ref >60)
GFR calc non Af Amer: 48 mL/min — ABNORMAL LOW (ref >60)
Glucose, Bld: 133 mg/dL — ABNORMAL HIGH (ref 65–99)
POTASSIUM: 4.1 mmol/L (ref 3.5–5.1)
SODIUM: 133 mmol/L — AB (ref 135–145)

## 2017-01-20 LAB — CBC
HEMATOCRIT: 41.3 % (ref 40.0–52.0)
Hemoglobin: 14.3 g/dL (ref 13.0–18.0)
MCH: 32.7 pg (ref 26.0–34.0)
MCHC: 34.6 g/dL (ref 32.0–36.0)
MCV: 94.4 fL (ref 80.0–100.0)
Platelets: 169 10*3/uL (ref 150–440)
RBC: 4.37 MIL/uL — ABNORMAL LOW (ref 4.40–5.90)
RDW: 13.9 % (ref 11.5–14.5)
WBC: 6 10*3/uL (ref 3.8–10.6)

## 2017-01-20 LAB — TROPONIN I: Troponin I: <0.03 ng/mL (ref <0.03)

## 2017-01-20 MED ORDER — ISOSORBIDE MONONITRATE ER 30 MG PO TB24: 30.0000 mg | ORAL_TABLET | Freq: Every day | ORAL | 2 refills | Status: DC

## 2017-01-20 MED ORDER — ISOSORBIDE MONONITRATE ER 60 MG PO TB24
30.0000 mg | ORAL_TABLET | ORAL | Status: AC
  Administered 2017-01-20: 30 mg via ORAL
  Filled 2017-01-20: qty 1

## 2017-01-20 NOTE — Assessment & Plan Note (Signed)
Patient had a carotid ultrasound. 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  Recommend:  Given the patient's asymptomatic subcritical stenosis no further invasive testing or surgery at this time.   Continue antiplatelet therapy as prescribed Continue management of CAD, HTN and Hyperlipidemia Healthy heart diet,  encouraged exercise at least 4 times per week Follow up in 6 months with duplex ultrasound and physical exam based on >50% stenosis of the right carotid artery

## 2017-01-20 NOTE — ED Triage Notes (Signed)
Pt complains of left sided chest pressure and generalized weakness starting this morning, pt denies any other symptoms

## 2017-01-20 NOTE — Patient Instructions (Signed)
Carotid Artery Disease The carotid arteries are arteries on both sides of the neck. They carry blood to the brain. Carotid artery disease is when the arteries get smaller (narrow) or get blocked. If these arteries get smaller or get blocked, you are more likely to have a stroke or warning stroke (transient ischemic attack). Follow these instructions at home:  Take medicines as told by your doctor. Make sure you understand all your medicine instructions. Do not stop your medicines without talking to your doctor first.  Follow your doctor's diet instructions. It is important to eat a healthy diet that includes plenty of: ? Fresh fruits. ? Vegetables. ? Lean meats.  Avoid: ? High-fat foods. ? High-sodium foods. ? Foods that are fried, overly processed, or have poor nutritional value.  Stay a healthy weight.  Stay active. Get at least 30 minutes of activity every day.  Do not smoke.  Limit alcohol use to: ? No more than 2 drinks a day for men. ? No more than 1 drink a day for women who are not pregnant.  Do not use illegal drugs.  Keep all doctor visits as told. Get help right away if:  You have sudden weakness or loss of feeling (numbness) on one side of the body, such as the face, arm, or leg.  You have sudden confusion.  You have trouble speaking (aphasia) or understanding.  You have sudden trouble seeing out of one or both eyes.  You have sudden trouble walking.  You have dizziness or feel like you might pass out (faint).  You have a loss of balance or your movements are not steady (uncoordinated).  You have a sudden, severe headache with no known cause.  You have trouble swallowing (dysphagia). Call your local emergency services (911 in [place] Do notdrive yourself to the clinic or hospital. This information is not intended to replace advice given to you by your health care provider. Make sure you discuss any questions you have with your health care  provider. Document Released: 05/02/2012 Document Revised: 10/22/2015 Document Reviewed: 11/14/2012 Elsevier Interactive Patient Education  2018 [employer]  

## 2017-01-20 NOTE — Progress Notes (Signed)
Patient ID: Douglas Phelps, male   DOB: 17-Dec-1940, 76 y.o.   MRN: [id number]  Chief Complaint  Patient presents with  . New Patient (Initial Visit)    CAS    HPI Douglas Phelps is a 76 y.o. male.  I am asked to see the patient by Dr. Doy Hutching for evaluation of carotid disease.  The patient reports No focal neurologic symptoms. Specifically, the patient denies amaurosis fugax, speech or swallowing difficulties, or arm or leg weakness or numbness. With his history of heart disease and atherosclerotic risk factors, his primary care physician noted a right carotid bruit on duplex and recommended he have a carotid ultrasound. 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 He is in his usual state of health today without specific complaints.   Past Medical History:  Diagnosis Date  . GERD (gastroesophageal reflux disease)   . High cholesterol   . Hx of CABG   . Hypertension   . Renal disorder   . Thyroid disease     Past Surgical History:  Procedure Laterality Date  . APPENDECTOMY    . CARDIAC SURGERY     CABG x 3  06/2000  . COLON SURGERY    . PROSTATE SURGERY      Family History No bleeding disorders, clotting disorders, autoimmune diseases, or aneurysms  Social History Social History  Substance Use Topics  . Smoking status: Never Smoker  . Smokeless tobacco: Never Used  . Alcohol use No  No IVDU  Allergies  Allergen Reactions  . Lotensin [Benazepril] Other (See Comments)    Other reaction(s): Other (See Comments) cough Cough   . Prilosec [Omeprazole] Other (See Comments)    Other reaction(s): Other (See Comments) headache headache  . Benadryl [Diphenhydramine Hcl (Sleep)] Other (See Comments)    Jittery   . Hydralazine Hcl     Other reaction(s): Kidney Disorder    Current Outpatient Prescriptions  Medication Sig Dispense Refill  . amLODipine (NORVASC) 5 MG tablet Take 5 mg by mouth.    .  Ascorbic Acid (VITAMIN C) 1000 MG tablet Take by mouth.    Marland Kitchen aspirin EC 81 MG tablet Take 81 mg by mouth.    Marland Kitchen atorvastatin (LIPITOR) 10 MG tablet Take 10 mg by mouth.    . B Complex Vitamins (VITAMIN-B COMPLEX) TABS Take by mouth.    . calcium carbonate (CALCIUM 600) 600 MG TABS tablet Take 600 mg by mouth.    Mariane Baumgarten Sodium 150 MG/15ML syrup Take by mouth.    . esomeprazole (NEXIUM) 40 MG capsule Take 40 mg by mouth.    . isosorbide mononitrate (IMDUR) 30 MG 24 hr tablet Take 1 tablet (30 mg total) by mouth daily. 30 tablet 0  . levothyroxine (SYNTHROID) 125 MCG tablet Take by mouth.    . levothyroxine (SYNTHROID, LEVOTHROID) 100 MCG tablet Take by mouth.    . losartan (COZAAR) 100 MG tablet Take by mouth.    . Magnesium Oxide 500 MG TABS Take by mouth.    . metoprolol succinate (TOPROL-XL) 50 MG 24 hr tablet Take 50 mg by mouth.    . Multiple Vitamin (MULTI-VITAMINS) TABS Take by mouth.    . omega-3 acid ethyl esters (LOVAZA) 1 g capsule TAKE 1 CAPSULE (1 G TOTAL) BY MOUTH 2 (TWO) TIMES DAILY.    . sildenafil (REVATIO) 20 MG tablet 5 tabs po QHS    . silodosin (RAPAFLO) 8 MG CAPS  capsule Take by mouth.    . valsartan (DIOVAN) 160 MG tablet Take 160 mg by mouth.    . Vitamin D, Cholecalciferol, 400 units TABS Take by mouth.     No current facility-administered medications for this visit.       REVIEW OF SYSTEMS (Negative unless checked)  Constitutional: [] Weight loss  [] Fever  [] Chills Cardiac: [] Chest pain   [] Chest pressure   [] Palpitations   [] Shortness of breath when laying flat   [] Shortness of breath at rest   [] Shortness of breath with exertion. Vascular:  [] Pain in legs with walking   [] Pain in legs at rest   [] Pain in legs when laying flat   [] Claudication   [] Pain in feet when walking  [] Pain in feet at rest  [] Pain in feet when laying flat   [] History of DVT   [] Phlebitis   [] Swelling in legs   [] Varicose veins   [] Non-healing ulcers Pulmonary:   [] Uses home oxygen    [] Productive cough   [] Hemoptysis   [] Wheeze  [] COPD   [] Asthma Neurologic:  [] Dizziness  [] Blackouts   [] Seizures   [] History of stroke   [] History of TIA  [] Aphasia   [] Temporary blindness   [] Dysphagia   [] Weakness or numbness in arms   [] Weakness or numbness in legs Musculoskeletal:  [x] Arthritis   [] Joint swelling   [] Joint pain   [] Low back pain Hematologic:  [] Easy bruising  [] Easy bleeding   [] Hypercoagulable state   [] Anemic  [] Hepatitis Gastrointestinal:  [] Blood in stool   [] Vomiting blood  [] Gastroesophageal reflux/heartburn   [] Abdominal pain Genitourinary:  [] Chronic kidney disease   [] Difficult urination  [] Frequent urination  [] Burning with urination   [] Hematuria Skin:  [] Rashes   [] Ulcers   [] Wounds Psychological:  [] History of anxiety   []  History of major depression.    Physical Exam BP (!) 160/70 (BP Location: Right Arm)   Pulse 60   Resp 15   Ht 5\' 6"  (1.676 m)   Wt 171 lb (77.6 kg)   BMI 27.60 kg/m  Gen:  WD/WN, NAD. Appears younger than stated age Head: [place]/AT, No temporalis wasting.  Ear/Nose/Throat: Hearing grossly intact, nares w/o erythema or drainage, oropharynx w/o Erythema/Exudate Eyes: Conjunctiva clear, sclera non-icteric  Neck: trachea midline.  No JVD. Soft right carotid bruit Pulmonary:  Good air movement, clear to auscultation bilaterally.  Cardiac: RRR, normal S1, S2, no Murmurs, rubs or gallops. Vascular:  Vessel Right Left  Radial Palpable Palpable                          PT Palpable Palpable  DP Palpable Palpable   Gastrointestinal: soft, non-tender/non-distended.  Musculoskeletal: M/S 5/5 throughout.  Extremities without ischemic changes.  No deformity or atrophy. No edema. Neurologic: Sensation grossly intact in extremities.  Symmetrical.  Speech is fluent. Motor exam as listed above. Psychiatric: Judgment intact, Mood & affect appropriate for pt's clinical situation. Dermatologic: No rashes or ulcers noted.  No cellulitis or open  wounds.    Radiology No results found.  Labs No results found for this or any previous visit (from the past 2160 hour(s)).  Assessment/Plan:  High cholesterol lipid control important in reducing the progression of atherosclerotic disease. Continue statin therapy   Hypertension blood pressure control important in reducing the progression of atherosclerotic disease. On appropriate oral medications.   Hx of CABG No recent anginal symptoms  Carotid stenosis Patient had a carotid ultrasound. This demonstrated velocities consistent with 50-75%  right ICA stenosis and less than 50% left ICA stenosis.  Recommend:  Given the patient's asymptomatic subcritical stenosis no further invasive testing or surgery at this time.   Continue antiplatelet therapy as prescribed Continue management of CAD, HTN and Hyperlipidemia Healthy heart diet,  encouraged exercise at least 4 times per week Follow up in 6 months with duplex ultrasound and physical exam based on >50% stenosis of the right carotid artery          Leotis Pain 01/20/2017, 10:55 AM   This note was created with Dragon medical transcription system.  Any errors from dictation are unintentional.

## 2017-01-20 NOTE — Discharge Instructions (Addendum)
You have been seen in the Emergency Department (ED) today for chest pressure.  As we have discussed today's test results are normal, but you may require further testing.  Please follow up with the recommended doctor as instructed above in these documents regarding today's emergent visit and your recent symptoms to discuss further management.  Continue to take your regular medications. If you are not doing so already, please also take a daily baby aspirin (81 mg), at least until you follow up with your doctor.  Return to the Emergency Department (ED) if you experience any further chest pain/pressure/tightness, difficulty breathing, or sudden sweating, or other symptoms that concern you.  

## 2017-01-20 NOTE — Assessment & Plan Note (Signed)
blood pressure control important in reducing the progression of atherosclerotic disease. On appropriate oral medications.  

## 2017-01-20 NOTE — Assessment & Plan Note (Signed)
lipid control important in reducing the progression of atherosclerotic disease. Continue statin therapy  

## 2017-01-20 NOTE — Assessment & Plan Note (Signed)
No recent anginal symptoms

## 2017-01-20 NOTE — ED Provider Notes (Signed)
[facility] Emergency Department Provider Note  ____________________________________________   First MD Initiated Contact with Patient 01/20/17 1942     (approximate)  I have reviewed the triage vital signs and the nursing notes.   HISTORY  Chief Complaint Chest Pain    HPI Douglas Phelps is a 76 y.o. male With the medical history and cardiac history as listed below who presents for evaluation of left-sided chest pressure as well as some generalized weakness.  He reports that the symptoms started relatively acutely this morning and have been persistent since that time.  He had a normal morning initially and went to see Dr. Lucky Cowboy with vascular surgery for a clinic appointment. he was given encouraging news that he would not require any surgery on his carotid arteries.  He went home and rested a bit and had lunch, but after he got up he noticed that he was having some mild left-sided chest pressure, but no pain.  Nothing in particular makes the patient's symptoms better nor worse.    He denies fever/chills, shortness of breath, nausea, vomiting, abdominal pain.  He also states that accompanying the chest pressure, however, is his sense of overall generalized weakness and malaise, although it is mild.  Given his cardiac history it made him concerned so he thought he should be evaluated.  dr. Nehemiah Massed is his cardiologist and Dr. Doy Hutching is his PCP.  He has not had any medication changes recently.  He does not take nitroglycerin and he no longer takes isosorbide mononitrate.   Past Medical History:  Diagnosis Date  . GERD (gastroesophageal reflux disease)   . High cholesterol   . Hx of CABG   . Hypertension   . Renal disorder   . Thyroid disease     Patient Active Problem List   Diagnosis Date Noted  . High cholesterol 01/20/2017  . Hypertension 01/20/2017  . Hx of CABG 01/20/2017  . Carotid stenosis 01/20/2017    Past Surgical History:  Procedure  Laterality Date  . APPENDECTOMY    . CARDIAC SURGERY     CABG x 3  06/2000  . COLON SURGERY    . PROSTATE SURGERY      Prior to Admission medications   Medication Sig Start Date End Date Taking? Authorizing Provider  amLODipine (NORVASC) 5 MG tablet Take 5 mg by mouth. 02/15/16   [provider]  Ascorbic Acid (VITAMIN C) 1000 MG tablet Take by mouth.    [provider]  aspirin EC 81 MG tablet Take 81 mg by mouth.    [provider]  atorvastatin (LIPITOR) 10 MG tablet Take 10 mg by mouth. 02/04/16 02/03/17  [provider]  B Complex Vitamins (VITAMIN-B COMPLEX) TABS Take by mouth. 06/11/12   [provider]  calcium carbonate (CALCIUM 600) 600 MG TABS tablet Take 600 mg by mouth. 06/11/12   [provider]  Docusate Sodium 150 MG/15ML syrup Take by mouth.    [provider]  esomeprazole (NEXIUM) 40 MG capsule Take 40 mg by mouth. 07/08/14   [provider]  isosorbide mononitrate (IMDUR) 30 MG 24 hr tablet Take 1 tablet (30 mg total) by mouth daily. 01/20/17 01/20/18  Hinda Kehr, MD  levothyroxine (SYNTHROID) 125 MCG tablet Take by mouth. 10/06/14   [provider]  levothyroxine (SYNTHROID, LEVOTHROID) 100 MCG tablet Take by mouth. 11/11/16 11/11/17  [provider]  losartan (COZAAR) 100 MG tablet Take by mouth. 01/06/17 01/06/18  [provider]  Magnesium Oxide 500 MG TABS Take by mouth.    [provider]  metoprolol succinate (TOPROL-XL) 50 MG 24 hr tablet Take 50 mg by mouth. 06/11/12   [provider]  Multiple Vitamin (MULTI-VITAMINS) TABS Take by mouth.    [provider]  omega-3 acid ethyl esters (LOVAZA) 1 g capsule TAKE 1 CAPSULE (1 G TOTAL) BY MOUTH 2 (TWO) TIMES DAILY. 08/22/16   [provider]  sildenafil (REVATIO) 20 MG tablet 5 tabs po QHS 12/08/15   [provider]  silodosin (RAPAFLO) 8 MG CAPS capsule Take by mouth. 11/01/16 11/01/17   [provider]  valsartan (DIOVAN) 160 MG tablet Take 160 mg by mouth. 06/11/12   [provider]  Vitamin D, Cholecalciferol, 400 units TABS Take by mouth. 06/11/12   [provider]    Allergies Lotensin [benazepril]; Prilosec [omeprazole]; Benadryl [diphenhydramine hcl (sleep)]; and Hydralazine hcl  No family history on file.  Social History Social History  Substance Use Topics  . Smoking status: Never Smoker  . Smokeless tobacco: Never Used  . Alcohol use No    Review of Systems Constitutional: No fever/chills Eyes: No visual changes. ENT: No sore throat. Cardiovascular: Denies chest pain but reports left sided chest pressure (mild) Respiratory: Denies shortness of breath. Gastrointestinal: No abdominal pain.  No nausea, no vomiting.  No diarrhea.  No constipation. Genitourinary: Negative for dysuria. Musculoskeletal: Negative for neck pain.  Negative for back pain. Integumentary: Negative for rash. Neurological: Negative for headaches, focal weakness or numbness.   ____________________________________________   PHYSICAL EXAM:  VITAL SIGNS: ED Triage Vitals  Enc Vitals Group     BP 01/20/17 1415 (!) 186/62     Pulse Rate 01/20/17 1415 67     Resp 01/20/17 1415 20     Temp 01/20/17 1415 97.8 F (36.6 C)     Temp Source 01/20/17 1415 Oral     SpO2 01/20/17 1415 97 %     Weight 01/20/17 1415 77.6 kg (171 lb)     Height 01/20/17 1415 1.676 m (5\' 6" )     Head Circumference --      Peak Flow --      Pain Score 01/20/17 1414 4     Pain Loc --      Pain Edu? --      Excl. in [place]? --     Constitutional: Alert and oriented. Well appearing and in no acute distress. Eyes: Conjunctivae are normal.  Head: Atraumatic. Nose: No congestion/rhinnorhea. Mouth/Throat: Mucous membranes are moist. Neck: No stridor.  No meningeal signs.   Cardiovascular: Normal rate, regular rhythm. Good peripheral circulation. Grossly normal heart sounds. Old,  well-healed sternotomy scaron chest.  No reproducible chest wall tenderness. Respiratory: Normal respiratory effort.  No retractions. Lungs CTAB. Gastrointestinal: Soft and nontender. No distention.  Musculoskeletal: No lower extremity tenderness nor edema. No gross deformities of extremities. Neurologic:  Normal speech and language. No gross focal neurologic deficits are appreciated.  Skin:  Skin is warm, dry and intact. No rash noted. Psychiatric: Mood and affect are normal. Speech and behavior are normal.  ____________________________________________   LABS (all labs ordered are listed, but only abnormal results are displayed)  Labs Reviewed  BASIC METABOLIC PANEL - Abnormal; Notable for the following:       Result Value   Sodium 133 (*)    CO2 21 (*)    Glucose, Bld 133 (*)    BUN 21 (*)    Creatinine, Ser 1.40 (*)  GFR calc non Af Amer 48 (*)    GFR calc Af Amer 55 (*)    All other components within normal limits  CBC - Abnormal; Notable for the following:    RBC 4.37 (*)    All other components within normal limits  TROPONIN I  TROPONIN I   ____________________________________________  EKG  ED ECG REPORT I, Sharetha Newson, the attending physician, personally viewed and interpreted this ECG.  Date: 01/20/2017 EKG Time: 14:12 Rate: 74 Rhythm: normal sinus rhythm QRS Axis: normal Intervals: normal ST/T Wave abnormalities: normal Narrative Interpretation: no evidence of acute ischemia  ____________________________________________  RADIOLOGY   Dg Chest 2 View  Result Date: 01/20/2017 CLINICAL DATA:  Chest pain. EXAM: CHEST  2 VIEW COMPARISON:  02/06/2016 FINDINGS: The heart size and mediastinal contours are within normal limits. Both lungs are clear. No effusions. The visualized skeletal structures are unremarkable. CABG. Calcification in the thoracic aorta.  Moderate hiatal hernia. IMPRESSION: No acute abnormalities. Aortic atherosclerosis. Hiatal hernia.  Electronically Signed   By: Lorriane Shire M.D.   On: 01/20/2017 14:46    ____________________________________________   PROCEDURES  Critical Care performed: No   Procedure(s) performed:   Procedures   ____________________________________________   INITIAL IMPRESSION / ASSESSMENT AND PLAN / ED COURSE  Pertinent labs & imaging results that were available during my care of the patient were reviewed by me and considered in my medical decision making (see chart for details).  the patient's blood pressure is slightly elevated but otherwise his vital signs are normal.  His EKG is reassuring and his troponin is negative.  Hiskidney function is at baseline with no electrolyte abnormalities.  I will repeat a troponin given that the onset was later in the day although I anticipate it will be negative.  He certainly is at risk given his prior cardiac history but he is very stable as symptoms are mild.  Dr. Nehemiah Massed is on call so I will touch base with him and discussed the case, but I anticipate we may be able to treat him with nitroglycerin or Imdur and schedule him for close outpatient follow-up.  I doubt there would be a benefit coming into the hospital at this time.    Clinical Course as of Jan 21 209  Fri Jan 20, 2017  2002 Secretary has paged Dr. Nehemiah Massed  [CF]  2011 I spoke by phone with Dr. Nehemiah Massed.  We discussed the case and possible treatment options.  He recommended Imdur 30 mg by mouth now with a prescription for the patient to take at home, particularly if it helps.  He will see thent in clinic on Monday morning at 11 AM.  I will send a message to Dr. Nehemiah Massed through [facility] [employer] so that he has the patient's record. assuming that the second troponin is negative we will discharge as planned.  I updated the patient and family and they are all in agreement.  [CF]  2108 negative second troponin.  Patient feels unchanged after the Imdur but it is long-acting and it was only administered about  20-30 minutes ago.  I gave strict return precautions but I believe that the follow-up plan on Monday with Dr. Nehemiah Massed is appropriate.  The patient and family agree.  [CF]    Clinical Course User Index [CF] Hinda Kehr, MD    ____________________________________________  FINAL CLINICAL IMPRESSION(S) / ED DIAGNOSES  Final diagnoses:  Left chest pressure     MEDICATIONS GIVEN DURING THIS VISIT:  Medications  isosorbide mononitrate (IMDUR)  24 hr tablet 30 mg (30 mg Oral Given 01/20/17 2019)     NEW OUTPATIENT MEDICATIONS STARTED DURING THIS VISIT:  Discharge Medication List as of 01/20/2017  9:10 PM      Discharge Medication List as of 01/20/2017  9:10 PM    CONTINUE these medications which have CHANGED   Details  isosorbide mononitrate (IMDUR) 30 MG 24 hr tablet Take 1 tablet (30 mg total) by mouth daily., Starting Fri 01/20/2017, Until Sat 01/20/2018, Print        Discharge Medication List as of 01/20/2017  9:10 PM       Note:  This document was prepared using Dragon voice recognition software and may include unintentional dictation errors.    Hinda Kehr, MD 01/21/17 [phone]

## 2017-02-20 DIAGNOSIS — I6523 Occlusion and stenosis of bilateral carotid arteries: Secondary | ICD-10-CM | POA: Insufficient documentation

## 2017-07-25 ENCOUNTER — Ambulatory Visit (INDEPENDENT_AMBULATORY_CARE_PROVIDER_SITE_OTHER): Payer: Medicare Other

## 2017-07-25 ENCOUNTER — Ambulatory Visit (INDEPENDENT_AMBULATORY_CARE_PROVIDER_SITE_OTHER): Payer: Medicare Other | Admitting: Vascular Surgery

## 2017-07-25 VITALS — BP 142/68 | HR 56 | Resp 16 | Ht 66.0 in | Wt 175.0 lb

## 2017-07-25 DIAGNOSIS — I10 Essential (primary) hypertension: Secondary | ICD-10-CM | POA: Diagnosis not present

## 2017-07-25 DIAGNOSIS — E7800 Pure hypercholesterolemia, unspecified: Secondary | ICD-10-CM

## 2017-07-25 DIAGNOSIS — I6523 Occlusion and stenosis of bilateral carotid arteries: Secondary | ICD-10-CM

## 2017-07-25 NOTE — Assessment & Plan Note (Signed)
Carotid duplex today demonstrates velocities on the lower end of 40-59% bilaterally. Continue current medical regimen including aspirin and statin agent.  No role for intervention at this level.  Recheck in 1 year.

## 2017-07-25 NOTE — Progress Notes (Signed)
MRN : [id number]  Douglas Phelps is a 77 y.o. (1940/12/18) male who presents with chief complaint of  Chief Complaint  Patient presents with  . Carotid    ultrasound  .  History of Present Illness: Patient returns in follow-up of carotid disease.  He is doing well without any specific complaints or problems at this time.  No focal neurologic symptoms. Specifically, the patient denies amaurosis fugax, speech or swallowing difficulties, or arm or leg weakness or numbness.  Carotid duplex today demonstrates velocities on the lower end of 40-59% bilaterally.  Past Medical History:  Diagnosis Date  . GERD (gastroesophageal reflux disease)   . High cholesterol   . Hx of CABG   . Hypertension   . Renal disorder   . Thyroid disease          Past Surgical History:  Procedure Laterality Date  . APPENDECTOMY    . CARDIAC SURGERY     CABG x 3  06/2000  . COLON SURGERY    . PROSTATE SURGERY      Family History No bleeding disorders, clotting disorders, autoimmune diseases, or aneurysms  Social History     Social History  Substance Use Topics  . Smoking status: Never Smoker  . Smokeless tobacco: Never Used  . Alcohol use No  No IVDU       Allergies  Allergen Reactions  . Lotensin [Benazepril] Other (See Comments)    Other reaction(s): Other (See Comments) cough Cough   . Prilosec [Omeprazole] Other (See Comments)    Other reaction(s): Other (See Comments) headache headache  . Benadryl [Diphenhydramine Hcl (Sleep)] Other (See Comments)    Jittery   . Hydralazine Hcl     Other reaction(s): Kidney Disorder          Current Outpatient Prescriptions  Medication Sig Dispense Refill  . amLODipine (NORVASC) 5 MG tablet Take 5 mg by mouth.    . Ascorbic Acid (VITAMIN C) 1000 MG tablet Take by mouth.    Marland Kitchen aspirin EC 81 MG tablet Take 81 mg by mouth.    Marland Kitchen atorvastatin (LIPITOR) 10 MG tablet Take 10 mg by mouth.    . B Complex  Vitamins (VITAMIN-B COMPLEX) TABS Take by mouth.    . calcium carbonate (CALCIUM 600) 600 MG TABS tablet Take 600 mg by mouth.    Mariane Baumgarten Sodium 150 MG/15ML syrup Take by mouth.    . esomeprazole (NEXIUM) 40 MG capsule Take 40 mg by mouth.    . isosorbide mononitrate (IMDUR) 30 MG 24 hr tablet Take 1 tablet (30 mg total) by mouth daily. 30 tablet 0  . levothyroxine (SYNTHROID) 125 MCG tablet Take by mouth.    . levothyroxine (SYNTHROID, LEVOTHROID) 100 MCG tablet Take by mouth.    . losartan (COZAAR) 100 MG tablet Take by mouth.    . Magnesium Oxide 500 MG TABS Take by mouth.    . metoprolol succinate (TOPROL-XL) 50 MG 24 hr tablet Take 50 mg by mouth.    . Multiple Vitamin (MULTI-VITAMINS) TABS Take by mouth.    . omega-3 acid ethyl esters (LOVAZA) 1 g capsule TAKE 1 CAPSULE (1 G TOTAL) BY MOUTH 2 (TWO) TIMES DAILY.    . sildenafil (REVATIO) 20 MG tablet 5 tabs po QHS    . silodosin (RAPAFLO) 8 MG CAPS capsule Take by mouth.    . valsartan (DIOVAN) 160 MG tablet Take 160 mg by mouth.    . Vitamin D, Cholecalciferol, 400 units  TABS Take by mouth.     No current facility-administered medications for this visit.       REVIEW OF SYSTEMS (Negative unless checked)  Constitutional: [] Weight loss  [] Fever  [] Chills Cardiac: [] Chest pain   [] Chest pressure   [] Palpitations   [] Shortness of breath when laying flat   [] Shortness of breath at rest   [] Shortness of breath with exertion. Vascular:  [] Pain in legs with walking   [] Pain in legs at rest   [] Pain in legs when laying flat   [] Claudication   [] Pain in feet when walking  [] Pain in feet at rest  [] Pain in feet when laying flat   [] History of DVT   [] Phlebitis   [] Swelling in legs   [] Varicose veins   [] Non-healing ulcers Pulmonary:   [] Uses home oxygen   [] Productive cough   [] Hemoptysis   [] Wheeze  [] COPD   [] Asthma Neurologic:  [] Dizziness  [] Blackouts   [] Seizures   [] History of stroke   [] History of  TIA  [] Aphasia   [] Temporary blindness   [] Dysphagia   [] Weakness or numbness in arms   [] Weakness or numbness in legs Musculoskeletal:  [x] Arthritis   [] Joint swelling   [] Joint pain   [] Low back pain Hematologic:  [] Easy bruising  [] Easy bleeding   [] Hypercoagulable state   [] Anemic  [] Hepatitis Gastrointestinal:  [] Blood in stool   [] Vomiting blood  [] Gastroesophageal reflux/heartburn   [] Abdominal pain Genitourinary:  [] Chronic kidney disease   [] Difficult urination  [] Frequent urination  [] Burning with urination   [] Hematuria Skin:  [] Rashes   [] Ulcers   [] Wounds Psychological:  [] History of anxiety   []  History of major depression.      Physical Examination  Vitals:   07/25/17 1407 07/25/17 1409  BP: 129/63 (!) 142/68  Pulse: (!) 56   Resp: 16   Weight: 79.4 kg (175 lb)   Height: 5\' 6"  (1.676 m)    Body mass index is 28.25 kg/m. Gen:  WD/WN, NAD. Appears younger than stated age. Head: [place]/AT, No temporalis wasting. Ear/Nose/Throat: Hearing grossly intact, nares w/o erythema or drainage, trachea midline Eyes: Conjunctiva clear. Sclera non-icteric Neck: Supple.  No bruit.  Pulmonary:  Good air movement, equal and clear to auscultation bilaterally.  Cardiac: RRR, no JVD Vascular:  Vessel Right Left  Radial Palpable Palpable                                    Musculoskeletal: M/S 5/5 throughout.  No deformity or atrophy. No edema. Neurologic: CN 2-12 intact. Sensation grossly intact in extremities.  Symmetrical.  Speech is fluent. Motor exam as listed above. Psychiatric: Judgment intact, Mood & affect appropriate for pt's clinical situation. Dermatologic: No rashes or ulcers noted.  No cellulitis or open wounds.      CBC Lab Results  Component Value Date   WBC 6.0 01/20/2017   HGB 14.3 01/20/2017   HCT 41.3 01/20/2017   MCV 94.4 01/20/2017   PLT 169 01/20/2017    BMET    Component Value Date/Time   NA 133 (L) 01/20/2017 1415   NA 134 (L) 01/26/2013  1511   K 4.1 01/20/2017 1415   K 4.8 01/26/2013 1511   CL 102 01/20/2017 1415   CL 103 01/26/2013 1511   CO2 21 (L) 01/20/2017 1415   CO2 25 01/26/2013 1511   GLUCOSE 133 (H) 01/20/2017 1415   GLUCOSE 133 (H) 01/26/2013 1511  BUN 21 (H) 01/20/2017 1415   BUN 30 (H) 01/26/2013 1511   CREATININE 1.40 (H) 01/20/2017 1415   CREATININE 1.52 (H) 01/26/2013 1511   CALCIUM 9.1 01/20/2017 1415   CALCIUM 9.3 01/26/2013 1511   GFRNONAA 48 (L) 01/20/2017 1415   GFRNONAA 45 (L) 01/26/2013 1511   GFRAA 55 (L) 01/20/2017 1415   GFRAA 53 (L) 01/26/2013 1511   CrCl cannot be calculated (Patient's most recent lab result is older than the maximum 21 days allowed.).  COAG No results found for: INR, PROTIME  Radiology No results found.   Assessment/Plan High cholesterol lipid control important in reducing the progression of atherosclerotic disease. Continue statin therapy   Hypertension blood pressure control important in reducing the progression of atherosclerotic disease. On appropriate oral medications.   Hx of CABG No recent anginal symptoms   Carotid stenosis Carotid duplex today demonstrates velocities on the lower end of 40-59% bilaterally. Continue current medical regimen including aspirin and statin agent.  No role for intervention at this level.  Recheck in 1 year.    Leotis Pain, MD  07/25/2017 2:50 PM    This note was created with Dragon medical transcription system.  Any errors from dictation are purely unintentional

## 2017-07-25 NOTE — Patient Instructions (Signed)
Carotid Artery Disease The carotid arteries are arteries on both sides of the neck. They carry blood to the brain. Carotid artery disease is when the arteries get smaller (narrow) or get blocked. If these arteries get smaller or get blocked, you are more likely to have a stroke or warning stroke (transient ischemic attack). Follow these instructions at home:  Take medicines as told by your doctor. Make sure you understand all your medicine instructions. Do not stop your medicines without talking to your doctor first.  Follow your doctor's diet instructions. It is important to eat a healthy diet that includes plenty of: ? Fresh fruits. ? Vegetables. ? Lean meats.  Avoid: ? High-fat foods. ? High-sodium foods. ? Foods that are fried, overly processed, or have poor nutritional value.  Stay a healthy weight.  Stay active. Get at least 30 minutes of activity every day.  Do not smoke.  Limit alcohol use to: ? No more than 2 drinks a day for men. ? No more than 1 drink a day for women who are not pregnant.  Do not use illegal drugs.  Keep all doctor visits as told. Get help right away if:  You have sudden weakness or loss of feeling (numbness) on one side of the body, such as the face, arm, or leg.  You have sudden confusion.  You have trouble speaking (aphasia) or understanding.  You have sudden trouble seeing out of one or both eyes.  You have sudden trouble walking.  You have dizziness or feel like you might pass out (faint).  You have a loss of balance or your movements are not steady (uncoordinated).  You have a sudden, severe headache with no known cause.  You have trouble swallowing (dysphagia). Call your local emergency services (911 in [place] Do notdrive yourself to the clinic or hospital. This information is not intended to replace advice given to you by your health care provider. Make sure you discuss any questions you have with your health care  provider. Document Released: 05/02/2012 Document Revised: 10/22/2015 Document Reviewed: 11/14/2012 Elsevier Interactive Patient Education  2018 [employer]  

## 2017-08-21 ENCOUNTER — Emergency Department
Admission: EM | Admit: 2017-08-21 | Discharge: 2017-08-21 | Disposition: A | Discharge status: Home / Self Care | Payer: Medicare Other | Attending: Emergency Medicine | Admitting: Emergency Medicine

## 2017-08-21 ENCOUNTER — Emergency Department: Payer: Medicare Other

## 2017-08-21 ENCOUNTER — Encounter: Payer: Self-pay | Admitting: Emergency Medicine

## 2017-08-21 ENCOUNTER — Other Ambulatory Visit: Payer: Self-pay

## 2017-08-21 DIAGNOSIS — I10 Essential (primary) hypertension: Secondary | ICD-10-CM | POA: Diagnosis not present

## 2017-08-21 DIAGNOSIS — R0789 Other chest pain: Secondary | ICD-10-CM | POA: Insufficient documentation

## 2017-08-21 DIAGNOSIS — Z79899 Other long term (current) drug therapy: Secondary | ICD-10-CM | POA: Insufficient documentation

## 2017-08-21 DIAGNOSIS — I2510 Atherosclerotic heart disease of native coronary artery without angina pectoris: Secondary | ICD-10-CM | POA: Insufficient documentation

## 2017-08-21 DIAGNOSIS — Z951 Presence of aortocoronary bypass graft: Secondary | ICD-10-CM | POA: Insufficient documentation

## 2017-08-21 LAB — BASIC METABOLIC PANEL
ANION GAP: 11 (ref 5–15)
BUN: 24 mg/dL — ABNORMAL HIGH (ref 6–20)
CALCIUM: 10.1 mg/dL (ref 8.9–10.3)
CO2: 23 mmol/L (ref 22–32)
CREATININE: 1.44 mg/dL — AB (ref 0.61–1.24)
Chloride: 99 mmol/L — ABNORMAL LOW (ref 101–111)
GFR calc Af Amer: 53 mL/min — ABNORMAL LOW (ref >60)
GFR, EST NON AFRICAN AMERICAN: 46 mL/min — AB (ref >60)
GLUCOSE: 114 mg/dL — AB (ref 65–99)
Potassium: 5 mmol/L (ref 3.5–5.1)
Sodium: 133 mmol/L — ABNORMAL LOW (ref 135–145)

## 2017-08-21 LAB — CBC
HCT: 43.3 % (ref 40.0–52.0)
HEMOGLOBIN: 14.7 g/dL (ref 13.0–18.0)
MCH: 32.2 pg (ref 26.0–34.0)
MCHC: 33.8 g/dL (ref 32.0–36.0)
MCV: 95.3 fL (ref 80.0–100.0)
PLATELETS: 218 10*3/uL (ref 150–440)
RBC: 4.55 MIL/uL (ref 4.40–5.90)
RDW: 13.5 % (ref 11.5–14.5)
WBC: 10 10*3/uL (ref 3.8–10.6)

## 2017-08-21 LAB — HEPATIC FUNCTION PANEL
ALT: 26 U/L (ref 17–63)
AST: 31 U/L (ref 15–41)
Albumin: 4.6 g/dL (ref 3.5–5.0)
Alkaline Phosphatase: 53 U/L (ref 38–126)
Total Bilirubin: 0.6 mg/dL (ref 0.3–1.2)
Total Protein: 7.8 g/dL (ref 6.5–8.1)

## 2017-08-21 LAB — TROPONIN I

## 2017-08-21 LAB — LIPASE, BLOOD: LIPASE: 21 U/L (ref 11–51)

## 2017-08-21 NOTE — ED Provider Notes (Addendum)
[facility] Emergency Department Provider Note  ____________________________________________   I have reviewed the triage vital signs and the nursing notes. Where available I have reviewed prior notes and, if possible and indicated, outside hospital notes.    HISTORY  Chief Complaint Chest Pain    HPI Douglas Phelps is a 77 y.o. male  Douglas Phelps is a patient who does have a history of CAD, CABG, high cholesterol and reflux disease.  He is here because he is having a discomfort in his chest that he states is consistent with a muscular skeletal injury.  He absolutely denies that this has anything to do with his heart.  However he wants to be on the safe side and get "checked out".  The patient states he adamantly does not wish to be admitted to the hospital, he states that he has a chest discomfort which she feels is muscular skeletal worse when he changes position or moves but not worse when he exerts himself, not pleuritic no shortness of breath, otherwise feels 100% fine.  He states that is been there for 3 days, it is worse when he uses his arms to move things but not worse when he walks.  Is nonradiating, it is a "muscle soreness" in the chest with no other symptoms.  He states he also has chronic trapezius muscle discomfort which is not related to or in any way associated with this particular pathology today.  Patient states that if his cardiac enzymes are negative he would like to go home.  He has had he states innumerable stress test for similar kinds of discomfort in his chest that are all negative and he is very sure that this is not cardiac.     Past Medical History:  Diagnosis Date  . GERD (gastroesophageal reflux disease)   . High cholesterol   . Hx of CABG   . Hypertension   . Renal disorder   . Thyroid disease     Patient Active Problem List   Diagnosis Date Noted  . High cholesterol 01/20/2017  . Hypertension 01/20/2017  . Hx of CABG  01/20/2017  . Carotid stenosis 01/20/2017    Past Surgical History:  Procedure Laterality Date  . APPENDECTOMY    . CARDIAC SURGERY     CABG x 3  06/2000  . COLON SURGERY    . PROSTATE SURGERY      Prior to Admission medications   Medication Sig Start Date End Date Taking? Authorizing Provider  amLODipine (NORVASC) 5 MG tablet Take 5 mg by mouth 2 (two) times daily.  02/15/16  Yes [provider]  Ascorbic Acid (VITAMIN C) 1000 MG tablet Take 1,000 mg by mouth daily.    Yes [provider]  aspirin EC 81 MG tablet Take 81 mg by mouth daily.    Yes [provider]  atorvastatin (LIPITOR) 10 MG tablet Take 10 mg by mouth daily at 6 PM.  02/04/16 06/22/18 Yes [provider]  B Complex Vitamins (VITAMIN-B COMPLEX) TABS Take 1 tablet by mouth daily.  06/11/12  Yes [provider]  calcium carbonate (CALCIUM 600) 600 MG TABS tablet Take 600 mg by mouth 2 (two) times daily with a meal.  06/11/12  Yes [provider]  esomeprazole (NEXIUM) 40 MG capsule Take 40 mg by mouth every other day.  07/08/14  Yes [provider]  levothyroxine (SYNTHROID) 125 MCG tablet Take 125 mcg by mouth daily before breakfast.  10/06/14  Yes [provider]  loratadine (CLARITIN) 10 MG tablet Take 10 mg by mouth daily as needed for allergies.   Yes [provider]  losartan (COZAAR) 100 MG tablet Take 100 mg by mouth daily.  01/06/17 01/06/18 Yes [provider]  Magnesium Oxide 500 MG TABS Take 500 mg by mouth daily.    Yes [provider]  metoprolol succinate (TOPROL-XL) 50 MG 24 hr tablet Take 50 mg by mouth 2 (two) times daily.  06/11/12  Yes [provider]  Multiple Vitamin (MULTI-VITAMINS) TABS Take 1 tablet by mouth daily.    Yes [provider]  omega-3 acid ethyl esters (LOVAZA) 1 g capsule TAKE 1 CAPSULE (1 G TOTAL) BY MOUTH 2 (TWO) TIMES DAILY. 08/22/16  Yes [provider]  sildenafil  (REVATIO) 20 MG tablet 5 tabs po QHS as needed 12/08/15  Yes [provider]  Vitamin D, Cholecalciferol, 400 units TABS Take 400 Units by mouth daily.  06/11/12  Yes [provider]    Allergies Lotensin [benazepril]; Prilosec [omeprazole]; Benadryl [diphenhydramine hcl (sleep)]; and Hydralazine hcl  History reviewed. No pertinent family history.  Social History Social History   Tobacco Use  . Smoking status: Never Smoker  . Smokeless tobacco: Never Used  Substance Use Topics  . Alcohol use: No  . Drug use: No    Review of Systems Constitutional: No fever/chills Eyes: No visual changes. ENT: No sore throat. No stiff neck no neck pain Cardiovascular: + chest pain. Respiratory: Denies shortness of breath. Gastrointestinal:   no vomiting.  No diarrhea.  No constipation. Genitourinary: Negative for dysuria. Musculoskeletal: Negative lower extremity swelling Skin: Negative for rash. Neurological: Negative for severe headaches, focal weakness or numbness.   ____________________________________________   PHYSICAL EXAM:  VITAL SIGNS: ED Triage Vitals  Enc Vitals Group     BP 08/21/17 1703 (!) 165/60     Pulse Rate 08/21/17 1703 65     Resp 08/21/17 1703 18     Temp 08/21/17 1703 99 F (37.2 C)     Temp Source 08/21/17 1703 Oral     SpO2 08/21/17 1703 97 %     Weight 08/21/17 1704 170 lb (77.1 kg)     Height 08/21/17 1704 5\' 7"  (1.702 m)     Head Circumference --      Peak Flow --      Pain Score 08/21/17 1703 2     Pain Loc --      Pain Edu? --      Excl. in [place]? --     Constitutional: Alert and oriented. Well appearing and in no acute distress. Eyes: Conjunctivae are normal Head: Atraumatic HEENT: No congestion/rhinnorhea. Mucous membranes are moist.  Oropharynx non-erythematous Neck:   Nontender with no meningismus, no masses, no stridor there is minimal tenderness palpation left trapezius muscle which is productive of his  symptoms. Cardiovascular: Normal rate, regular rhythm. Grossly normal heart sounds.  Good peripheral circulation. Chest: Minimal tenderness palpation of the chest wall which reproduces the patient's symptoms, no crepitus no flail chest, no shingles lesions. Respiratory: Normal respiratory effort.  No retractions. Lungs CTAB. Abdominal: Soft and nontender. No distention. No guarding no rebound Back:  There is no focal tenderness or step off.  there is no midline tenderness there are no lesions noted. there is no CVA tenderness  Musculoskeletal: No lower extremity tenderness, no upper extremity tenderness. No joint effusions, no DVT signs strong distal pulses no edema Neurologic:  Normal speech and language. No  gross focal neurologic deficits are appreciated.  Skin:  Skin is warm, dry and intact. No rash noted. Psychiatric: Mood and affect are normal. Speech and behavior are normal.  ____________________________________________   LABS (all labs ordered are listed, but only abnormal results are displayed)  Labs Reviewed  BASIC METABOLIC PANEL - Abnormal; Notable for the following components:      Result Value   Sodium 133 (*)    Chloride 99 (*)    Glucose, Bld 114 (*)    BUN 24 (*)    Creatinine, Ser 1.44 (*)    GFR calc non Af Amer 46 (*)    GFR calc Af Amer 53 (*)    All other components within normal limits  HEPATIC FUNCTION PANEL - Abnormal; Notable for the following components:   Bilirubin, Direct <0.1 (*)    All other components within normal limits  CBC  TROPONIN I  LIPASE, BLOOD    Pertinent labs  results that were available during my care of the patient were reviewed by me and considered in my medical decision making (see chart for details). ____________________________________________  EKG  I personally interpreted any EKGs ordered by me or triage Sinus rhythm at 64 bpm no acute ST elevation acute ST depression normal axis unremarkable EKG nonspecific ST changes  noted ____________________________________________  RADIOLOGY  Pertinent labs & imaging results that were available during my care of the patient were reviewed by me and considered in my medical decision making (see chart for details). If possible, patient and/or family made aware of any abnormal findings.  Dg Chest 2 View  Result Date: 08/21/2017 CLINICAL DATA:  77 y/o M; jittery and fatigued with recent episode of nausea and vomiting. EXAM: CHEST - 2 VIEW COMPARISON:  01/20/2017 chest radiograph FINDINGS: Stable normal cardiac silhouette. Post median sternotomy with wires intact. Aortic atherosclerosis with calcification. Clear lungs. No pleural effusion or pneumothorax. No acute osseous abnormality is evident. IMPRESSION: No acute pulmonary process identified. Electronically Signed   By: Kristine Garbe M.D.   On: 08/21/2017 17:24   ____________________________________________    PROCEDURES  Procedure(s) performed: None  Procedures  Critical Care performed: None  ____________________________________________   INITIAL IMPRESSION / ASSESSMENT AND PLAN / ED COURSE  Pertinent labs & imaging results that were available during my care of the patient were reviewed by me and considered in my medical decision making (see chart for details).  Patient here with atypical chest pain but a significant cardiac history.  Workup is negative including troponin despite the fact that he has had symptoms for 3 days.  Very low suspicion given clinical scenario for ACS PE or dissection however given his history, the prudent thing probably would be to admit the patient to the hospital and I have explained the risk benefits and alternatives of admission as well as discharge and he is adamant that he would like to go home if his second enzyme is negative.  I do not think this is unreasonable given his history and his knowledge of his health, his abdomen is completely benign there is no evidence of  referred abdominal discomfort.  ----------------------------------------- 7:48 PM on 08/21/2017 -----------------------------------------  Made comfortable in the emergency room, we will recent cardiac enzymes, I did discuss with Dr. Nehemiah Massed his cardiologist who agrees with management and will see him tomorrow.  He does agree with discharge.  At this time, there does not appear to be clinical evidence to support the diagnosis of pulmonary embolus, dissection, myocarditis, endocarditis, pericarditis, pericardial  tamponade, acute coronary syndrome, pneumothorax, pneumonia, or any other acute intrathoracic pathology that will require admission or acute intervention. Nor is there evidence of any significant intra-abdominal pathology causing this discomfort. Signed out to Dr. Rip Harbour at the end of my shift   ____________________________________________   FINAL CLINICAL IMPRESSION(S) / ED DIAGNOSES  Final diagnoses:  None      This chart was dictated using voice recognition software.  Despite best efforts to proofread,  errors can occur which can change meaning.      Schuyler Amor, MD 08/21/17 Sharilyn Sites    Schuyler Amor, MD 08/21/17 Marjo Bicker    Schuyler Amor, MD 08/21/17 2003

## 2017-08-21 NOTE — ED Notes (Signed)
Pt denies chest pain.  Pt states that he had muscle pain in his chest and neck that was tender to the touch.  Pt reports that he knows that it wasn't his heart.  Pt assured that tests were done regardless to be absolutely sure.  Verbalized understanding.

## 2017-08-21 NOTE — ED Notes (Signed)
Pt discharged to home.  Family member driving.  Discharge instructions reviewed.  Verbalized understanding.  No questions or concerns at this time.  Teach back verified.  Pt in NAD.  No items left in ED.   

## 2017-08-21 NOTE — ED Triage Notes (Signed)
Pt reports that he has been jittery and fatigued but Dr. Doy Hutching is changing around his thyroid medication. Yesterday he had N/V but not today. Reports that he has been out doing lawn work and is having left neck,arm and chest pain. Denies SOB, diaphoresis.

## 2017-08-21 NOTE — Discharge Instructions (Signed)
Your workup here is very reassuring.  We have offered you admission to the hospital but you would prefer to go home.  If you have any new or worrisome symptoms including chest pain, shortness of breath, or you feel worse in any way do not hesitate to return immediately to the emergency department.  Please follow with your cardiologist first thing tomorrow.

## 2017-09-15 IMAGING — RF DG UGI W/ KUB
9 of 17 series · 12 of 24 positions shown · non-contrast
Comparison: None.

CLINICAL DATA: Burning in the esophagus for 3 weeks

EXAM:
UPPER GI SERIES WITH KUB
TECHNIQUE: After obtaining a scout radiograph a routine upper GI series was
performed using thin and high density barium.
FLUOROSCOPY TIME:  Fluoroscopy Time:  1 minutes 6 seconds
Radiation Exposure Index (if provided by the fluoroscopic device):
11.5 mGy
Number of Acquired Spot Images: 0

[Series 2: cp_standard · 0.50mm/px · 2 of 31 frames shown (1 of 9)]
[frame 5/31]
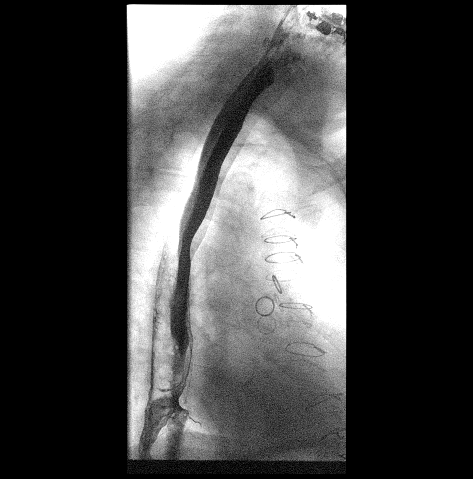
[frame 27/31]
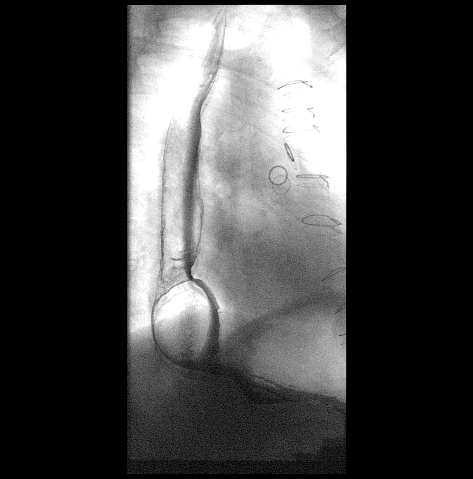

[Series 5: cp_standard · 0.51mm/px · 2 of 24 frames shown (2 of 9)]
[frame 4/24]
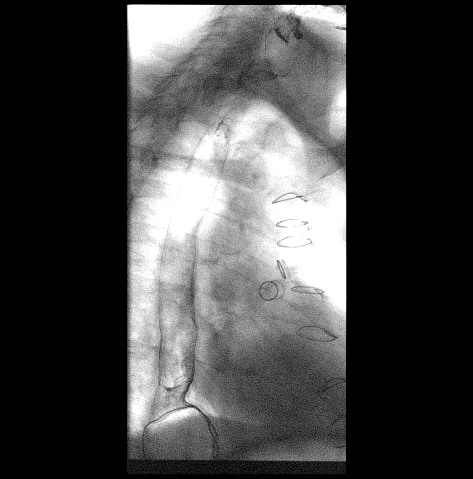
[frame 14/24]
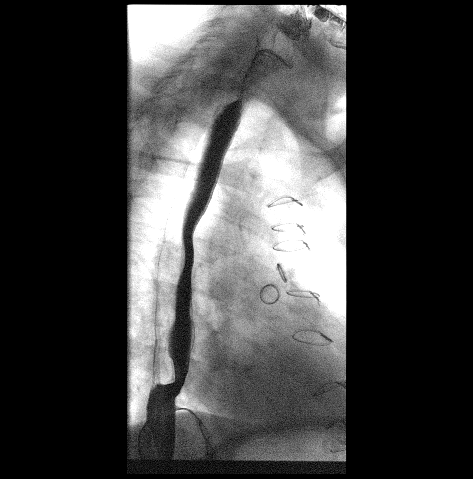

[Series 7: cp_standard · 0.51mm/px · 2 of 12 frames shown (3 of 9)]
[frame 2/12]
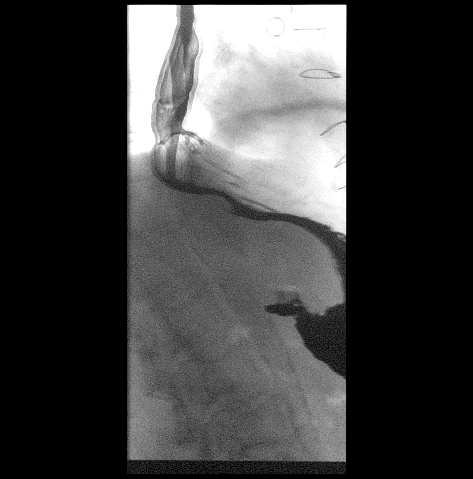
[frame 9/12]
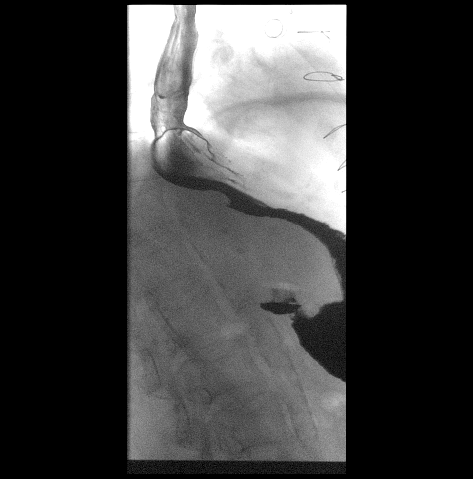

[Series 9: cp_standard · 0.27mm/px · 1 of 1 slices shown (4 of 9)]
[im 1/1]
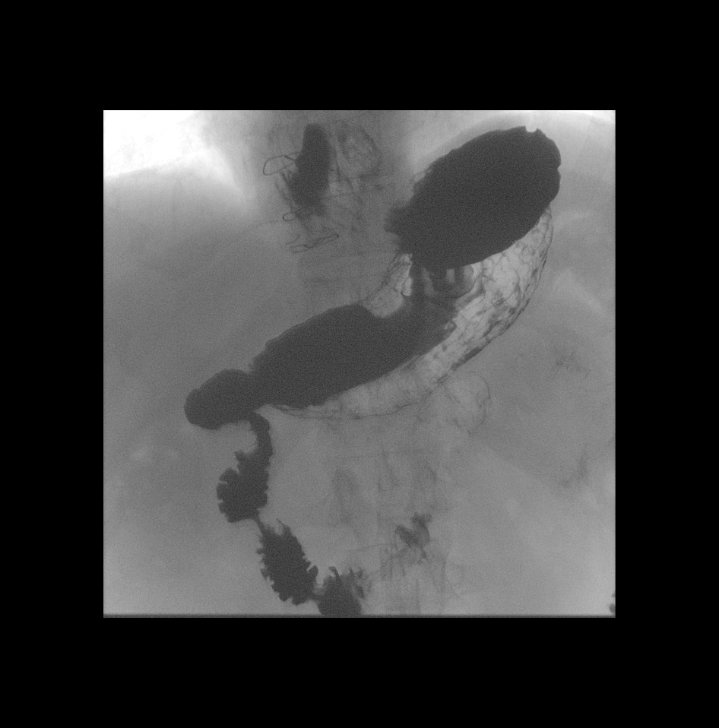

[Series 12: cp_standard · 0.27mm/px · 1 of 1 slices shown (5 of 9)]
[im 1/1]
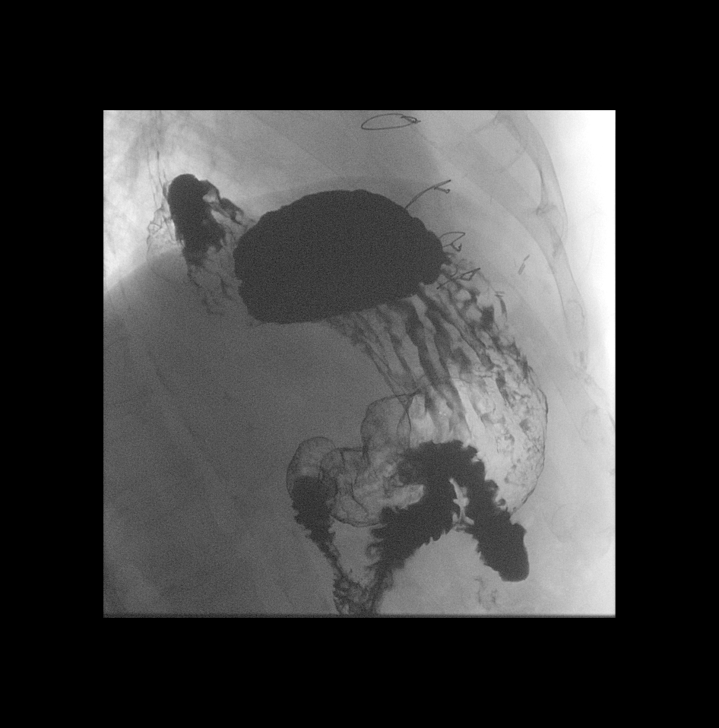

[Series 14: cp_standard · 0.28mm/px · 1 of 1 slices shown (6 of 9)]
[im 1/1]
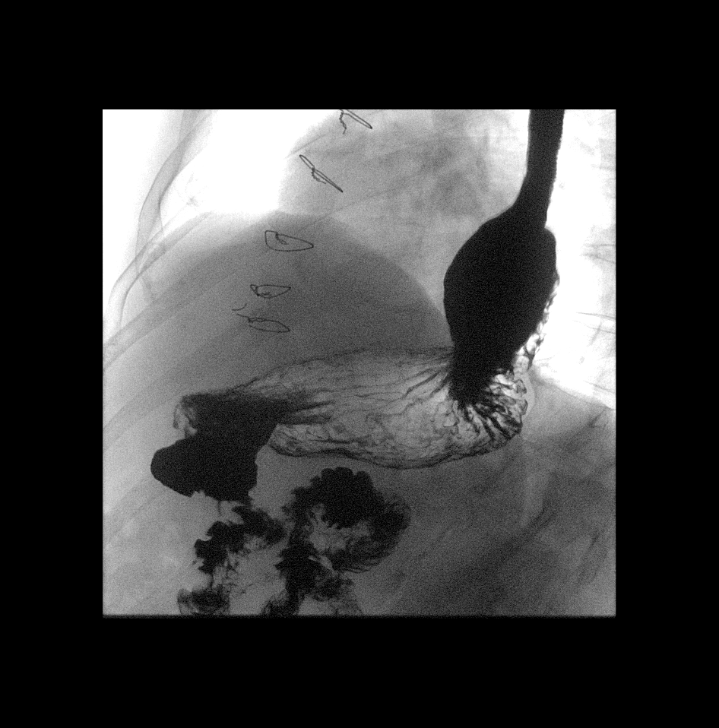

[Series 17: cp_standard · 0.28mm/px · 1 of 1 slices shown (7 of 9)]
[im 1/1]
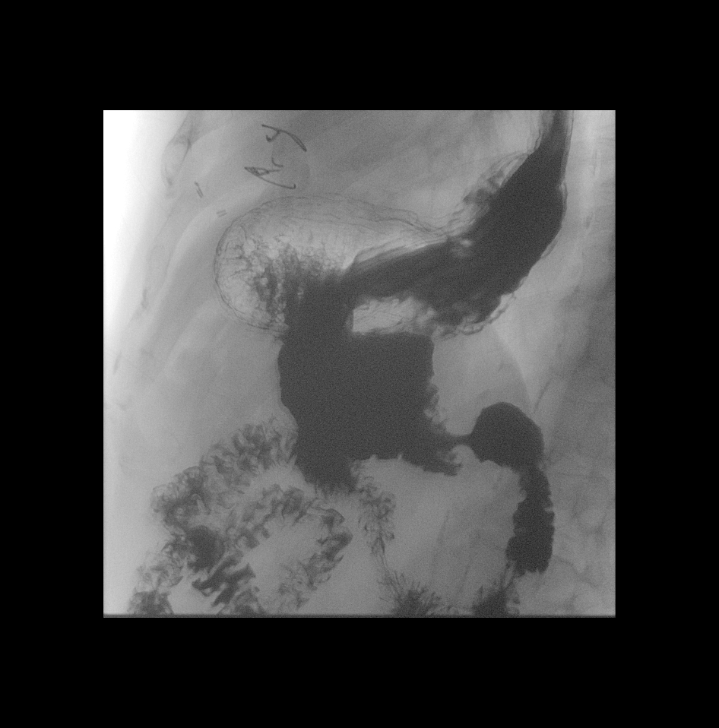

[Series 19: cp_standard · 0.28mm/px · 1 of 1 slices shown (8 of 9)]
[im 1/1]
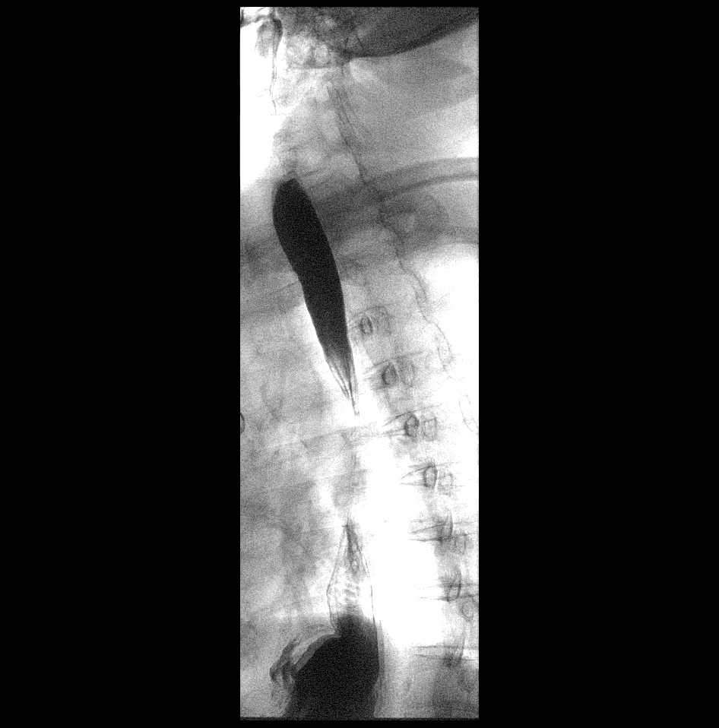

[Series 22: cp_standard · 0.28mm/px · 1 of 1 slices shown (9 of 9)]
[im 1/1]
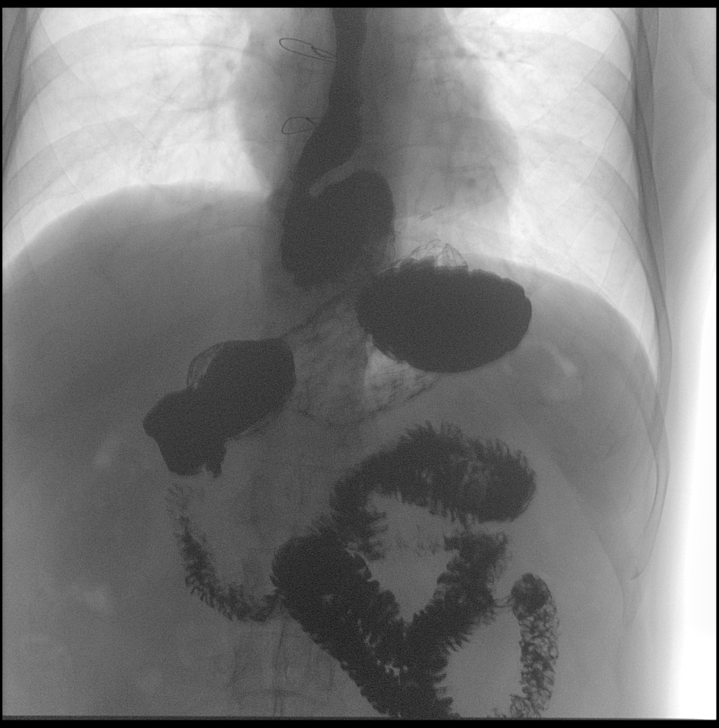

[12 of 24 positions shown; findings below may reference images not displayed]

FINDINGS: KUB:

There is no bowel dilatation to suggest obstruction. There is no
evidence of pneumoperitoneum, portal venous gas or pneumatosis.

There are no pathologic calcifications along the expected course of
the ureters.

The osseous structures are unremarkable.

Upper GI:

Examination of the esophagus demonstrated normal esophageal
motility. Normal esophageal morphology without evidence of
ulceration. Mild reticulation of the distal esophagus at the
gastroesophageal junction . No esophageal stricture, diverticula, or
mass lesion. Moderate size hiatal hernia throughout the exam. Severe
gastroesophageal reflux.

Examination of the stomach demonstrated normal rugal folds and areae
gastricae. The gastric mucosa appeared unremarkable without evidence
of ulceration, scarring, or mass lesion. Gastric motility and
emptying was normal. Fluoroscopic examination of the duodenum
demonstrates normal motility and morphology without evidence of
ulceration or mass lesion.

At the end of the examination a 13 mm barium tablet was administered
which transited through the esophagus and esophagogastric junction
without delay.
IMPRESSION: 1. Moderate size hiatal hernia with severe gastroesophageal reflux.
2. Mild reticulation of the distal esophagus may reflect mild
esophagitis.

## 2018-05-08 ENCOUNTER — Other Ambulatory Visit: Payer: Self-pay

## 2018-05-08 ENCOUNTER — Emergency Department
Admission: EM | Admit: 2018-05-08 | Discharge: 2018-05-08 | Disposition: A | Discharge status: Home / Self Care | Payer: Medicare Other | Attending: Student in an Organized Health Care Education/Training Program | Admitting: Student in an Organized Health Care Education/Training Program

## 2018-05-08 ENCOUNTER — Encounter: Payer: Self-pay | Admitting: Emergency Medicine

## 2018-05-08 ENCOUNTER — Emergency Department: Payer: Medicare Other

## 2018-05-08 DIAGNOSIS — E079 Disorder of thyroid, unspecified: Secondary | ICD-10-CM | POA: Diagnosis not present

## 2018-05-08 DIAGNOSIS — Z79899 Other long term (current) drug therapy: Secondary | ICD-10-CM | POA: Insufficient documentation

## 2018-05-08 DIAGNOSIS — Z7982 Long term (current) use of aspirin: Secondary | ICD-10-CM | POA: Insufficient documentation

## 2018-05-08 DIAGNOSIS — Z951 Presence of aortocoronary bypass graft: Secondary | ICD-10-CM | POA: Diagnosis not present

## 2018-05-08 DIAGNOSIS — I10 Essential (primary) hypertension: Secondary | ICD-10-CM | POA: Diagnosis not present

## 2018-05-08 DIAGNOSIS — R0789 Other chest pain: Secondary | ICD-10-CM | POA: Diagnosis not present

## 2018-05-08 LAB — CBC
HEMATOCRIT: 36.8 % — AB (ref 39.0–52.0)
Hemoglobin: 12.8 g/dL — ABNORMAL LOW (ref 13.0–17.0)
MCH: 33.5 pg (ref 26.0–34.0)
MCHC: 34.8 g/dL (ref 30.0–36.0)
MCV: 96.3 fL (ref 80.0–100.0)
Platelets: 172 10*3/uL (ref 150–400)
RBC: 3.82 MIL/uL — ABNORMAL LOW (ref 4.22–5.81)
RDW: 14.2 % (ref 11.5–15.5)
WBC: 6.2 10*3/uL (ref 4.0–10.5)
nRBC: 0 % (ref 0.0–0.2)

## 2018-05-08 LAB — BASIC METABOLIC PANEL
Anion gap: 8 (ref 5–15)
BUN: 21 mg/dL (ref 8–23)
CHLORIDE: 101 mmol/L (ref 98–111)
CO2: 24 mmol/L (ref 22–32)
CREATININE: 1.3 mg/dL — AB (ref 0.61–1.24)
Calcium: 8.7 mg/dL — ABNORMAL LOW (ref 8.9–10.3)
GFR calc Af Amer: >60 mL/min (ref >60)
GFR, EST NON AFRICAN AMERICAN: 53 mL/min — AB (ref >60)
GLUCOSE: 146 mg/dL — AB (ref 70–99)
POTASSIUM: 4.5 mmol/L (ref 3.5–5.1)
SODIUM: 133 mmol/L — AB (ref 135–145)

## 2018-05-08 LAB — TROPONIN I
Troponin I: <0.03 ng/mL (ref <0.03)
Troponin I: <0.03 ng/mL (ref <0.03)

## 2018-05-08 NOTE — ED Triage Notes (Signed)
Pt arrived via POV with reports of chest pressure that started after eating lunch this afternoon, pt has hx muscular pain in the chest. Pt states he was at exercise yesterday doing a machine that involved movement similar to rowing.  Pt states the pain has improved since arrival.   Pt sees Dr. Nehemiah Massed for cardiology, hx of bypass surgery.

## 2018-05-08 NOTE — ED Provider Notes (Signed)
[facility] Emergency Department Provider Note    First MD Initiated Contact with Patient 05/08/18 1520     (approximate)  I have reviewed the triage vital signs and the nursing notes.   HISTORY  Chief Complaint Chest Pain    HPI Douglas Phelps is a 77 y.o. male presents the ER for evaluation of anterior chest wall pain.  States that he thinks it is related to muscle strain.  States he was working on exercise bike that involves arm exercises yesterday.  States he was doing the exercise bike he did not have any chest discomfort shortness of breath or any symptoms.  States he was at lunch today and after getting up a chair he moved his arms and felt some discomfort and heaviness in his chest muscles.  States that the symptoms were brief.  Denies any shortness of breath.  No pleuritic pain.  No pain radiating through to his back or to his neck.  Did not become diaphoretic or clammy.  Did not have any associated nausea.  The pain is reproduced by moving his arms in the same motion as the exercise machine.    Past Medical History:  Diagnosis Date  . GERD (gastroesophageal reflux disease)   . High cholesterol   . Hx of CABG   . Hypertension   . Renal disorder   . Thyroid disease    History reviewed. No pertinent family history. Past Surgical History:  Procedure Laterality Date  . APPENDECTOMY    . CARDIAC SURGERY     CABG x 3  06/2000  . COLON SURGERY    . PROSTATE SURGERY     Patient Active Problem List   Diagnosis Date Noted  . High cholesterol 01/20/2017  . Hypertension 01/20/2017  . Hx of CABG 01/20/2017  . Carotid stenosis 01/20/2017      Prior to Admission medications   Medication Sig Start Date End Date Taking? Authorizing Provider  amLODipine (NORVASC) 5 MG tablet Take 5 mg by mouth 2 (two) times daily.  02/15/16   [provider]  Ascorbic Acid (VITAMIN C) 1000 MG tablet Take 1,000 mg by mouth daily.     [provider]  aspirin EC 81 MG tablet Take 81 mg by mouth daily.     [provider]  atorvastatin (LIPITOR) 10 MG tablet Take 10 mg by mouth daily at 6 PM.  02/04/16 06/22/18  [provider]  B Complex Vitamins (VITAMIN-B COMPLEX) TABS Take 1 tablet by mouth daily.  06/11/12   [provider]  calcium carbonate (CALCIUM 600) 600 MG TABS tablet Take 600 mg by mouth 2 (two) times daily with a meal.  06/11/12   [provider]  esomeprazole (NEXIUM) 40 MG capsule Take 40 mg by mouth every other day.  07/08/14   [provider]  levothyroxine (SYNTHROID) 125 MCG tablet Take 125 mcg by mouth daily before breakfast.  10/06/14   [provider]  loratadine (CLARITIN) 10 MG tablet Take 10 mg by mouth daily as needed for allergies.    [provider]  losartan (COZAAR) 100 MG tablet Take 100 mg by mouth daily.  01/06/17 01/06/18  [provider]  Magnesium Oxide 500 MG TABS Take 500 mg by mouth daily.     [provider]  metoprolol succinate (TOPROL-XL) 50 MG 24 hr tablet Take 50 mg by mouth 2 (two) times daily.  06/11/12   [provider]  Multiple Vitamin (MULTI-VITAMINS)  TABS Take 1 tablet by mouth daily.     [provider]  omega-3 acid ethyl esters (LOVAZA) 1 g capsule TAKE 1 CAPSULE (1 G TOTAL) BY MOUTH 2 (TWO) TIMES DAILY. 08/22/16   [provider]  sildenafil (REVATIO) 20 MG tablet 5 tabs po QHS as needed 12/08/15   [provider]  Vitamin D, Cholecalciferol, 400 units TABS Take 400 Units by mouth daily.  06/11/12   [provider]    Allergies Benazepril; Omeprazole; Benadryl [diphenhydramine hcl (sleep)]; Diphenhydramine hcl; Hydralazine hcl; and Other    Social History Social History   Tobacco Use  . Smoking status: Never Smoker  . Smokeless tobacco: Never Used  Substance Use Topics  . Alcohol use: No  . Drug use: No    Review of Systems Patient denies headaches, rhinorrhea,  blurry vision, numbness, shortness of breath, chest pain, edema, cough, abdominal pain, nausea, vomiting, diarrhea, dysuria, fevers, rashes or hallucinations unless otherwise stated above in HPI. ____________________________________________   PHYSICAL EXAM:  VITAL SIGNS: Vitals:   05/08/18 1355 05/08/18 1647  BP: (!) 173/57 (!) 153/61  Pulse: 71 60  Resp: 20 18  Temp: 97.9 F (36.6 C)   SpO2: 100% 100%    Constitutional: Alert and oriented.  Eyes: Conjunctivae are normal.  Head: Atraumatic. Nose: No congestion/rhinnorhea. Mouth/Throat: Mucous membranes are moist.   Neck: No stridor. Painless ROM.  Cardiovascular: Normal rate, regular rhythm. Grossly normal heart sounds.  Good peripheral circulation. Respiratory: Normal respiratory effort.  No retractions. Lungs CTAB. Gastrointestinal: Soft and nontender. No distention. No abdominal bruits. No CVA tenderness. Genitourinary:  Musculoskeletal: Pain reproduced with engaging the pectoralis muscles bilateral upper extremity.  No lower extremity tenderness nor edema.  No joint effusions. Neurologic:  Normal speech and language. No gross focal neurologic deficits are appreciated. No facial droop Skin:  Skin is warm, dry and intact. No rash noted. Psychiatric: Mood and affect are normal. Speech and behavior are normal.  ____________________________________________   LABS (all labs ordered are listed, but only abnormal results are displayed)  Results for orders placed or performed during the hospital encounter of 05/08/18 (from the past 24 hour(s))  Basic metabolic panel     Status: Abnormal   Collection Time: 05/08/18  1:56 PM  Result Value Ref Range   Sodium 133 (L) 135 - 145 mmol/L   Potassium 4.5 3.5 - 5.1 mmol/L   Chloride 101 98 - 111 mmol/L   CO2 24 22 - 32 mmol/L   Glucose, Bld 146 (H) 70 - 99 mg/dL   BUN 21 8 - 23 mg/dL   Creatinine, Ser 1.30 (H) 0.61 - 1.24 mg/dL   Calcium 8.7 (L) 8.9 - 10.3 mg/dL   GFR calc non Af  Amer 53 (L) >60 mL/min   GFR calc Af Amer >60 >60 mL/min   Anion gap 8 5 - 15  CBC     Status: Abnormal   Collection Time: 05/08/18  1:56 PM  Result Value Ref Range   WBC 6.2 4.0 - 10.5 K/uL   RBC 3.82 (L) 4.22 - 5.81 MIL/uL   Hemoglobin 12.8 (L) 13.0 - 17.0 g/dL   HCT 36.8 (L) 39.0 - 52.0 %   MCV 96.3 80.0 - 100.0 fL   MCH 33.5 26.0 - 34.0 pg   MCHC 34.8 30.0 - 36.0 g/dL   RDW 14.2 11.5 - 15.5 %   Platelets 172 150 - 400 K/uL   nRBC 0.0 0.0 - 0.2 %  Troponin I -  Add-On to previous collection     Status: None   Collection Time: 05/08/18  1:56 PM  Result Value Ref Range   Troponin I <0.03 <0.03 ng/mL  Troponin I - Once-Timed     Status: None   Collection Time: 05/08/18  4:45 PM  Result Value Ref Range   Troponin I <0.03 <0.03 ng/mL   ____________________________________________  EKG My review and personal interpretation at Time: 13:53   Indication: chest pain  Rate: 75  Rhythm: sinus Axis: normal Other: nonspecific st abn, no stemi, morphology unchanged from previous 3/36/19 ____________________________________________  RADIOLOGY  I personally reviewed all radiographic images ordered to evaluate for the above acute complaints and reviewed radiology reports and findings.  These findings were personally discussed with the patient.  Please see medical record for radiology report.  ____________________________________________   PROCEDURES  Procedure(s) performed:  Procedures    Critical Care performed: no ____________________________________________   INITIAL IMPRESSION / ASSESSMENT AND PLAN / ED COURSE  Pertinent labs & imaging results that were available during my care of the patient were reviewed by me and considered in my medical decision making (see chart for details).   DDX: ACS, pericarditis, esophagitis, boerhaaves, pe, dissection, pna, bronchitis, costochondritis   AMON COSTILLA is a 77 y.o. who presents to the ED with symptoms as described above.   Patient well-appearing and very pleasant.  Denies any discomfort or distress at this time.  Seems primarily musculoskeletal in nature but given his history of CABG will order serial enzymes to exclude myocardial damage.  Doubt PE or dissection.  No evidence of infectious process.  Doubt hypertensive urgency.  Patient is pain-free at this time.    Repeat troponin was negative.  Patient remains pain-free.  It is most consistent with muscle skeletal strain.  Discussed need for close follow-up with cardiology discussed signs and symptoms for which the patient should return to the ER.  As part of my medical decision making, I reviewed the following data within the [facility] notes reviewed and incorporated, Labs reviewed, notes from prior ED visits   ____________________________________________   FINAL CLINICAL IMPRESSION(S) / ED DIAGNOSES  Final diagnoses:  Chest wall pain      NEW MEDICATIONS STARTED DURING THIS VISIT:  Discharge Medication List as of 05/08/2018  5:24 PM       Note:  This document was prepared using Dragon voice recognition software and may include unintentional dictation errors.    Merlyn Lot, MD 05/08/18 2040

## 2018-05-08 NOTE — ED Notes (Signed)
Patient states he was walking through [place] when he felt  pressure in his chest. Denies N/V, SOB, dizziness. Reports history of CABG and states "I'm supposed to come be checked out when I feel this way". Patient denies any discomfort at this time.

## 2018-07-24 ENCOUNTER — Encounter (INDEPENDENT_AMBULATORY_CARE_PROVIDER_SITE_OTHER): Payer: Self-pay | Admitting: Vascular Surgery

## 2018-07-24 ENCOUNTER — Other Ambulatory Visit: Payer: Self-pay

## 2018-07-24 ENCOUNTER — Ambulatory Visit (INDEPENDENT_AMBULATORY_CARE_PROVIDER_SITE_OTHER): Payer: Medicare Other | Admitting: Vascular Surgery

## 2018-07-24 ENCOUNTER — Ambulatory Visit (INDEPENDENT_AMBULATORY_CARE_PROVIDER_SITE_OTHER): Payer: Medicare Other

## 2018-07-24 VITALS — BP 148/71 | HR 61 | Resp 10 | Ht 66.0 in | Wt 177.0 lb

## 2018-07-24 DIAGNOSIS — I6523 Occlusion and stenosis of bilateral carotid arteries: Secondary | ICD-10-CM

## 2018-07-24 DIAGNOSIS — E7800 Pure hypercholesterolemia, unspecified: Secondary | ICD-10-CM

## 2018-07-24 DIAGNOSIS — I10 Essential (primary) hypertension: Secondary | ICD-10-CM | POA: Diagnosis not present

## 2018-07-24 DIAGNOSIS — Z951 Presence of aortocoronary bypass graft: Secondary | ICD-10-CM | POA: Diagnosis not present

## 2018-07-24 NOTE — Assessment & Plan Note (Signed)
Carotid duplex today reveals stable velocities in the fall at the upper end of the 1 to 39% range bilaterally.  Previously, they have been on the lower end of the 40 to 59% range.  No role for intervention at this level.  Continue current medical regimen.  Recheck in 1 year.

## 2018-07-24 NOTE — Progress Notes (Signed)
MRN : [id number]  Douglas Phelps is a 78 y.o. (1941/05/07) male who presents with chief complaint of  Chief Complaint  Patient presents with  . Follow-up  .  History of Present Illness: Patient returns in follow-up of his carotid disease.  He is doing well logic symptoms. Specifically, the patient denies amaurosis fugax, speech or swallowing difficulties, or arm or leg weakness or numbness. Carotid duplex today reveals stable velocities in the fall at the upper end of the 1 to 39% range bilaterally.  Previously, they have been on the lower end of the 40 to 59% range.  Current Outpatient Medications  Medication Sig Dispense Refill  . amLODipine (NORVASC) 5 MG tablet Take 5 mg by mouth 2 (two) times daily.     . Ascorbic Acid (VITAMIN C) 1000 MG tablet Take 1,000 mg by mouth daily.     Marland Kitchen aspirin EC 81 MG tablet Take 81 mg by mouth daily.     Marland Kitchen atorvastatin (LIPITOR) 10 MG tablet Take 10 mg by mouth daily at 6 PM.     . B Complex Vitamins (VITAMIN-B COMPLEX) TABS Take 1 tablet by mouth daily.     . calcium carbonate (CALCIUM 600) 600 MG TABS tablet Take 600 mg by mouth 2 (two) times daily with a meal.     . esomeprazole (NEXIUM) 40 MG capsule Take 40 mg by mouth every other day.     . levothyroxine (SYNTHROID) 125 MCG tablet Take 125 mcg by mouth daily before breakfast.     . loratadine (CLARITIN) 10 MG tablet Take 10 mg by mouth daily as needed for allergies.    Marland Kitchen losartan (COZAAR) 100 MG tablet Take 100 mg by mouth daily.     . Magnesium Oxide 500 MG TABS Take 500 mg by mouth daily.     . metoprolol succinate (TOPROL-XL) 50 MG 24 hr tablet Take 50 mg by mouth 2 (two) times daily.     . Multiple Vitamin (MULTI-VITAMINS) TABS Take 1 tablet by mouth daily.     Marland Kitchen omega-3 acid ethyl esters (LOVAZA) 1 g capsule TAKE 1 CAPSULE (1 G TOTAL) BY MOUTH 2 (TWO) TIMES DAILY.    . ranitidine (ZANTAC) 150 MG tablet Take 150 mg by mouth daily.    . sildenafil (REVATIO) 20 MG tablet 5 tabs po QHS as  needed    . Vitamin D, Cholecalciferol, 400 units TABS Take 400 Units by mouth daily.      No current facility-administered medications for this visit.     Past Medical History:  Diagnosis Date  . GERD (gastroesophageal reflux disease)   . High cholesterol   . Hx of CABG   . Hypertension   . Renal disorder   . SI (sacroiliac) joint dysfunction   . Thyroid disease     Past Surgical History:  Procedure Laterality Date  . APPENDECTOMY    . CARDIAC SURGERY     CABG x 3  06/2000  . COLON SURGERY    . PROSTATE SURGERY     Family History No bleeding disorders, clotting disorders, autoimmune diseases, or aneurysms  Social History     Social History  Substance Use Topics  . Smoking status: Never Smoker  . Smokeless tobacco: Never Used  . Alcohol use No  No IVDU       Allergies  Allergen Reactions  . Lotensin [Benazepril] Other (See Comments)    Other reaction(s): Other (See Comments) cough Cough   . Prilosec [Omeprazole]  Other (See Comments)    Other reaction(s): Other (See Comments) headache headache  . Benadryl [Diphenhydramine Hcl (Sleep)] Other (See Comments)    Jittery   . Hydralazine Hcl     Other reaction(s): Kidney Disorder      REVIEW OF SYSTEMS(Negative unless checked)  Constitutional: [] ?Weight loss[] ?Fever[] ?Chills Cardiac:[] ?Chest pain[] ?Chest pressure[] ?Palpitations [] ?Shortness of breath when laying flat [] ?Shortness of breath at rest [] ?Shortness of breath with exertion. Vascular: [] ?Pain in legs with walking[] ?Pain in legsat rest[] ?Pain in legs when laying flat [] ?Claudication [] ?Pain in feet when walking [] ?Pain in feet at rest [] ?Pain in feet when laying flat [] ?History of DVT [] ?Phlebitis [] ?Swelling in legs [] ?Varicose veins [] ?Non-healing ulcers Pulmonary: [] ?Uses home oxygen [] ?Productive cough[] ?Hemoptysis [] ?Wheeze [] ?COPD [] ?Asthma Neurologic: [] ?Dizziness  [] ?Blackouts [] ?Seizures [] ?History of stroke [] ?History of TIA[] ?Aphasia [] ?Temporary blindness[] ?Dysphagia [] ?Weaknessor numbness in arms [] ?Weakness or numbnessin legs Musculoskeletal: [x] ?Arthritis [] ?Joint swelling [] ?Joint pain [] ?Low back pain Hematologic:[] ?Easy bruising[] ?Easy bleeding [] ?Hypercoagulable state [] ?Anemic [] ?Hepatitis Gastrointestinal:[] ?Blood in stool[] ?Vomiting blood[] ?Gastroesophageal reflux/heartburn[] ?Abdominal pain Genitourinary: [] ?Chronic kidney disease [] ?Difficulturination [] ?Frequenturination [] ?Burning with urination[] ?Hematuria Skin: [] ?Rashes [] ?Ulcers [] ?Wounds Psychological: [] ?History of anxiety[] ?History of major depression.     Physical Examination  Vitals:   07/24/18 1422  BP: (!) 148/71  Pulse: 61  Resp: 10  Weight: 177 lb (80.3 kg)  Height: 5\' 6"  (1.676 m)   Body mass index is 28.57 kg/m. Gen:  WD/WN, NAD Head: [place]/AT, No temporalis wasting. Ear/Nose/Throat: Hearing grossly intact, nares w/o erythema or drainage, trachea midline Eyes: Conjunctiva clear. Sclera non-icteric Neck: Supple.  No bruit  Pulmonary:  Good air movement, equal and clear to auscultation bilaterally.  Cardiac: RRR, No JVD Vascular:  Vessel Right Left  Radial Palpable Palpable   Musculoskeletal: M/S 5/5 throughout.  No deformity or atrophy.  Neurologic: CN 2-12 intact. Sensation grossly intact in extremities.  Symmetrical.  Speech is fluent. Motor exam as listed above. Psychiatric: Judgment intact, Mood & affect appropriate for pt's clinical situation. Dermatologic: No rashes or ulcers noted.  No cellulitis or open wounds.      CBC Lab Results  Component Value Date   WBC 6.2 05/08/2018   HGB 12.8 (L) 05/08/2018   HCT 36.8 (L) 05/08/2018   MCV 96.3 05/08/2018   PLT 172 05/08/2018    BMET    Component Value Date/Time   NA 133 (L) 05/08/2018 1356   NA 134 (L) 01/26/2013 1511   K 4.5  05/08/2018 1356   K 4.8 01/26/2013 1511   CL 101 05/08/2018 1356   CL 103 01/26/2013 1511   CO2 24 05/08/2018 1356   CO2 25 01/26/2013 1511   GLUCOSE 146 (H) 05/08/2018 1356   GLUCOSE 133 (H) 01/26/2013 1511   BUN 21 05/08/2018 1356   BUN 30 (H) 01/26/2013 1511   CREATININE 1.30 (H) 05/08/2018 1356   CREATININE 1.52 (H) 01/26/2013 1511   CALCIUM 8.7 (L) 05/08/2018 1356   CALCIUM 9.3 01/26/2013 1511   GFRNONAA 53 (L) 05/08/2018 1356   GFRNONAA 45 (L) 01/26/2013 1511   GFRAA >60 05/08/2018 1356   GFRAA 53 (L) 01/26/2013 1511   CrCl cannot be calculated (Patient's most recent lab result is older than the maximum 21 days allowed.).  COAG No results found for: INR, PROTIME  Radiology Vas US Carotid  Result Date: 07/24/2018 Carotid Arterial Duplex Study Indications:       Carotid stenosis. Comparison Study:  07/25/2017 Performing Technologist: Concha Norway RVT  Examination Guidelines: A complete evaluation includes B-mode imaging, spectral Doppler, color Doppler, and power Doppler as needed of all accessible portions  of each vessel. Bilateral testing is considered an integral part of a complete examination. Limited examinations for reoccurring indications may be performed as noted.  Right Carotid Findings: +----------+--------+--------+--------+----------------------+--------+           PSV cm/sEDV cm/sStenosisDescribe              Comments +----------+--------+--------+--------+----------------------+--------+ CCA Prox  98      15                                             +----------+--------+--------+--------+----------------------+--------+ CCA Mid   103     14                                             +----------+--------+--------+--------+----------------------+--------+ CCA Distal76      12                                             +----------+--------+--------+--------+----------------------+--------+ ICA Prox  67      11                                              +----------+--------+--------+--------+----------------------+--------+ ICA Mid   110     21      1-39%   calcific and irregular         +----------+--------+--------+--------+----------------------+--------+ ICA Distal103     17                                             +----------+--------+--------+--------+----------------------+--------+ ECA       131     0                                              +----------+--------+--------+--------+----------------------+--------+ +----------+--------+-------+----------------+-------------------+           PSV cm/sEDV cmsDescribe        Arm Pressure (mmHG) +----------+--------+-------+----------------+-------------------+ Subclavian150            Multiphasic, WNL                    +----------+--------+-------+----------------+-------------------+ +---------+--------+--+--------+---------+ VertebralPSV cm/s44EDV cm/sAntegrade +---------+--------+--+--------+---------+  Left Carotid Findings: +----------+--------+--------+--------+----------------------+--------+           PSV cm/sEDV cm/sStenosisDescribe              Comments +----------+--------+--------+--------+----------------------+--------+ CCA Prox  104     17                                             +----------+--------+--------+--------+----------------------+--------+ CCA Mid   115     15                                             +----------+--------+--------+--------+----------------------+--------+  CCA Distal108     15                                             +----------+--------+--------+--------+----------------------+--------+ ICA Prox  90      20                                             +----------+--------+--------+--------+----------------------+--------+ ICA Mid   107     26                                             +----------+--------+--------+--------+----------------------+--------+ ICA  Distal109     24      1-39%   calcific and irregular         +----------+--------+--------+--------+----------------------+--------+ ECA       112     1                                              +----------+--------+--------+--------+----------------------+--------+ +----------+--------+--------+---------+-------------------+ SubclavianPSV cm/sEDV cm/sDescribe Arm Pressure (mmHG) +----------+--------+--------+---------+-------------------+           214             Turbulent                    +----------+--------+--------+---------+-------------------+ +---------+--------+--+--------+---------+ VertebralPSV cm/s38EDV cm/sAntegrade +---------+--------+--+--------+---------+  Summary: Right Carotid: Velocities in the right ICA are consistent with a 1-39% stenosis.                Non-hemodynamically significant plaque <50% noted in the CCA. The                ECA appears <50% stenosed. Mild calcific plaque in the CCA, Bulb,                ICA. Left Carotid: Velocities in the left ICA are consistent with a 1-39% stenosis.               Non-hemodynamically significant plaque noted in the CCA. The ECA               appears <50% stenosed. Mild calcific plaque in the CCA, Bulb, ICA. Vertebrals:  Bilateral vertebral arteries demonstrate antegrade flow. Subclavians: Left subclavian artery flow was disturbed. Normal flow hemodynamics              were seen in the right subclavian artery. *See table(s) above for measurements and observations.  Electronically signed by Leotis Pain MD on 07/24/2018 at 2:42:41 PM.    Final      Assessment/Plan High cholesterol lipid control important in reducing the progression of atherosclerotic disease. Continue statin therapy   Hypertension blood pressure control important in reducing the progression of atherosclerotic disease. On appropriate oral medications.   Hx of CABG No recent anginal symptoms  Carotid stenosis Carotid duplex today  reveals stable velocities in the fall at the upper end of the 1 to 39% range bilaterally.  Previously, they have been on the lower end of the 40 to 59% range.  No role for  intervention at this level.  Continue current medical regimen.  Recheck in 1 year.    Leotis Pain, MD  07/24/2018 3:14 PM    This note was created with Dragon medical transcription system.  Any errors from dictation are purely unintentional

## 2018-08-09 ENCOUNTER — Other Ambulatory Visit: Payer: Self-pay | Admitting: Sports Medicine

## 2018-08-09 DIAGNOSIS — G8929 Other chronic pain: Secondary | ICD-10-CM

## 2018-08-09 DIAGNOSIS — M5442 Lumbago with sciatica, left side: Principal | ICD-10-CM

## 2018-08-15 ENCOUNTER — Other Ambulatory Visit: Payer: Self-pay

## 2018-08-15 ENCOUNTER — Ambulatory Visit
Admission: RE | Admit: 2018-08-15 | Discharge: 2018-08-15 | Disposition: A | Discharge status: Home / Self Care | Payer: Medicare Other | Source: Ambulatory Visit | Attending: Sports Medicine | Admitting: Sports Medicine

## 2018-08-15 DIAGNOSIS — G8929 Other chronic pain: Secondary | ICD-10-CM | POA: Insufficient documentation

## 2018-08-15 DIAGNOSIS — M5442 Lumbago with sciatica, left side: Secondary | ICD-10-CM | POA: Diagnosis present

## 2018-08-16 ENCOUNTER — Ambulatory Visit: Payer: Medicare Other

## 2018-08-27 DIAGNOSIS — M51379 Other intervertebral disc degeneration, lumbosacral region without mention of lumbar back pain or lower extremity pain: Secondary | ICD-10-CM | POA: Insufficient documentation

## 2018-08-27 DIAGNOSIS — M5137 Other intervertebral disc degeneration, lumbosacral region: Secondary | ICD-10-CM | POA: Insufficient documentation

## 2018-10-18 ENCOUNTER — Other Ambulatory Visit: Payer: Self-pay | Admitting: Internal Medicine

## 2018-10-18 DIAGNOSIS — M79652 Pain in left thigh: Secondary | ICD-10-CM

## 2018-11-06 ENCOUNTER — Other Ambulatory Visit: Payer: Self-pay

## 2018-11-06 ENCOUNTER — Ambulatory Visit
Admission: RE | Admit: 2018-11-06 | Discharge: 2018-11-06 | Disposition: A | Discharge status: Home / Self Care | Payer: Medicare Other | Source: Ambulatory Visit | Attending: Internal Medicine | Admitting: Internal Medicine

## 2018-11-06 DIAGNOSIS — M79652 Pain in left thigh: Secondary | ICD-10-CM | POA: Insufficient documentation

## 2018-11-28 ENCOUNTER — Other Ambulatory Visit: Payer: Self-pay | Admitting: Neurosurgery

## 2018-11-28 DIAGNOSIS — M4316 Spondylolisthesis, lumbar region: Secondary | ICD-10-CM

## 2018-11-29 ENCOUNTER — Telehealth: Payer: Self-pay | Admitting: Nurse Practitioner

## 2018-11-29 NOTE — Telephone Encounter (Signed)
Phone call to patient to verify medication list and allergies for myelogram procedure. Pt instructed to hold tramadol for 48hrs prior to myelogram appointment time. Pt verbalized understanding. Pre and post procedure instructions reviewed with pt. 

## 2018-12-19 ENCOUNTER — Other Ambulatory Visit: Payer: Medicare Other

## 2018-12-28 ENCOUNTER — Ambulatory Visit
Admission: RE | Admit: 2018-12-28 | Discharge: 2018-12-28 | Disposition: A | Discharge status: Home / Self Care | Payer: Medicare Other | Source: Ambulatory Visit | Attending: Neurosurgery | Admitting: Neurosurgery

## 2018-12-28 ENCOUNTER — Other Ambulatory Visit: Payer: Self-pay

## 2018-12-28 DIAGNOSIS — M4316 Spondylolisthesis, lumbar region: Secondary | ICD-10-CM

## 2018-12-28 MED ORDER — DIAZEPAM 5 MG PO TABS
5.0000 mg | ORAL_TABLET | Freq: Once | ORAL | Status: AC
  Administered 2018-12-28: 5 mg via ORAL

## 2018-12-28 MED ORDER — IOPAMIDOL (ISOVUE-M 200) INJECTION 41%: 1.0000 mL | Freq: Once | INTRAMUSCULAR | Status: DC

## 2018-12-28 MED ORDER — METHYLPREDNISOLONE ACETATE 40 MG/ML INJ SUSP (RADIOLOG: 120.0000 mg | Freq: Once | INTRAMUSCULAR | Status: DC

## 2018-12-28 MED ORDER — IOPAMIDOL (ISOVUE-M 200) INJECTION 41%
15.0000 mL | Freq: Once | INTRAMUSCULAR | Status: AC
  Administered 2018-12-28: 15 mL via INTRATHECAL

## 2018-12-28 NOTE — Progress Notes (Signed)
Patient states he has been off Tramadol for at least the past two days.

## 2018-12-28 NOTE — Discharge Instructions (Signed)
Myelogram Discharge Instructions  1. Go home and rest quietly for the next 24 hours.  It is important to lie flat for the next 24 hours.  Get up only to go to the restroom.  You may lie in the bed or on a couch on your back, your stomach, your left side or your right side.  You may have one pillow under your head.  You may have pillows between your knees while you are on your side or under your knees while you are on your back.  2. DO NOT drive today.  Recline the seat as far back as it will go, while still wearing your seat belt, on the way home.  3. You may get up to go to the bathroom as needed.  You may sit up for 10 minutes to eat.  You may resume your normal diet and medications unless otherwise indicated.  Drink lots of extra fluids today and tomorrow.  4. The incidence of headache, nausea, or vomiting is about 5% (one in 20 patients).  If you develop a headache, lie flat and drink plenty of fluids until the headache goes away.  Caffeinated beverages may be helpful.  If you develop severe nausea and vomiting or a headache that does not go away with flat bed rest, call [phone].  5. You may resume normal activities after your 24 hours of bed rest is over; however, do not exert yourself strongly or do any heavy lifting tomorrow. If when you get up you have a headache when standing, go back to bed and force fluids for another 24 hours.  6. Call your physician for a follow-up appointment.  The results of your myelogram will be sent directly to your physician by the following day.  7. If you have any questions or if complications develop after you arrive home, please call [phone].  Discharge instructions have been explained to the patient.  The patient, or the person responsible for the patient, fully understands these instructions.  YOU MAY RESTART YOUR TRAMADOL TOMORROW 12/29/2018 AT 09:30AM.

## 2019-01-07 DIAGNOSIS — M545 Low back pain, unspecified: Secondary | ICD-10-CM | POA: Insufficient documentation

## 2019-01-07 DIAGNOSIS — Z6827 Body mass index (BMI) 27.0-27.9, adult: Secondary | ICD-10-CM | POA: Insufficient documentation

## 2019-01-07 DIAGNOSIS — M48061 Spinal stenosis, lumbar region without neurogenic claudication: Secondary | ICD-10-CM | POA: Insufficient documentation

## 2019-05-06 ENCOUNTER — Other Ambulatory Visit: Payer: Self-pay | Admitting: Neurology

## 2019-05-06 DIAGNOSIS — M5136 Other intervertebral disc degeneration, lumbar region: Secondary | ICD-10-CM

## 2019-05-16 ENCOUNTER — Other Ambulatory Visit: Payer: Self-pay

## 2019-05-16 ENCOUNTER — Ambulatory Visit
Admission: RE | Admit: 2019-05-16 | Discharge: 2019-05-16 | Disposition: A | Discharge status: Home / Self Care | Payer: Medicare Other | Source: Ambulatory Visit | Attending: Neurology | Admitting: Neurology

## 2019-05-16 DIAGNOSIS — M5136 Other intervertebral disc degeneration, lumbar region: Secondary | ICD-10-CM

## 2019-05-28 ENCOUNTER — Emergency Department: Payer: Medicare Other

## 2019-05-28 ENCOUNTER — Emergency Department
Admission: EM | Admit: 2019-05-28 | Discharge: 2019-05-29 | Disposition: A | Discharge status: Home / Self Care | Payer: Medicare Other | Attending: Emergency Medicine | Admitting: Emergency Medicine

## 2019-05-28 ENCOUNTER — Encounter: Payer: Self-pay | Admitting: Emergency Medicine

## 2019-05-28 ENCOUNTER — Other Ambulatory Visit: Payer: Self-pay

## 2019-05-28 DIAGNOSIS — I129 Hypertensive chronic kidney disease with stage 1 through stage 4 chronic kidney disease, or unspecified chronic kidney disease: Secondary | ICD-10-CM | POA: Insufficient documentation

## 2019-05-28 DIAGNOSIS — I2510 Atherosclerotic heart disease of native coronary artery without angina pectoris: Secondary | ICD-10-CM | POA: Diagnosis not present

## 2019-05-28 DIAGNOSIS — Z7982 Long term (current) use of aspirin: Secondary | ICD-10-CM | POA: Diagnosis not present

## 2019-05-28 DIAGNOSIS — R002 Palpitations: Secondary | ICD-10-CM

## 2019-05-28 DIAGNOSIS — Z951 Presence of aortocoronary bypass graft: Secondary | ICD-10-CM | POA: Diagnosis not present

## 2019-05-28 DIAGNOSIS — I493 Ventricular premature depolarization: Secondary | ICD-10-CM

## 2019-05-28 DIAGNOSIS — Z79899 Other long term (current) drug therapy: Secondary | ICD-10-CM | POA: Insufficient documentation

## 2019-05-28 DIAGNOSIS — N183 Chronic kidney disease, stage 3 unspecified: Secondary | ICD-10-CM | POA: Insufficient documentation

## 2019-05-28 DIAGNOSIS — E039 Hypothyroidism, unspecified: Secondary | ICD-10-CM | POA: Insufficient documentation

## 2019-05-28 LAB — CBC
HCT: 36.3 % — ABNORMAL LOW (ref 39.0–52.0)
Hemoglobin: 12.5 g/dL — ABNORMAL LOW (ref 13.0–17.0)
MCH: 33.4 pg (ref 26.0–34.0)
MCHC: 34.4 g/dL (ref 30.0–36.0)
MCV: 97.1 fL (ref 80.0–100.0)
Platelets: 165 10*3/uL (ref 150–400)
RBC: 3.74 MIL/uL — ABNORMAL LOW (ref 4.22–5.81)
RDW: 12.2 % (ref 11.5–15.5)
WBC: 6.4 10*3/uL (ref 4.0–10.5)
nRBC: 0 % (ref 0.0–0.2)

## 2019-05-28 LAB — BASIC METABOLIC PANEL
Anion gap: 10 (ref 5–15)
BUN: 24 mg/dL — ABNORMAL HIGH (ref 8–23)
CO2: 23 mmol/L (ref 22–32)
Calcium: 9.5 mg/dL (ref 8.9–10.3)
Chloride: 98 mmol/L (ref 98–111)
Creatinine, Ser: 1.39 mg/dL — ABNORMAL HIGH (ref 0.61–1.24)
GFR calc Af Amer: 56 mL/min — ABNORMAL LOW (ref >60)
GFR calc non Af Amer: 49 mL/min — ABNORMAL LOW (ref >60)
Glucose, Bld: 136 mg/dL — ABNORMAL HIGH (ref 70–99)
Potassium: 4.3 mmol/L (ref 3.5–5.1)
Sodium: 131 mmol/L — ABNORMAL LOW (ref 135–145)

## 2019-05-28 LAB — TROPONIN I (HIGH SENSITIVITY): Troponin I (High Sensitivity): 10 ng/L

## 2019-05-28 MED ORDER — SODIUM CHLORIDE 0.9% FLUSH: 3.0000 mL | Freq: Once | INTRAVENOUS | Status: DC

## 2019-05-28 NOTE — ED Triage Notes (Signed)
Pt comes into the ED via EMS from home with c/o heart palpitations, hx of PVC's. States it just feels worse then normal

## 2019-05-28 NOTE — ED Notes (Signed)
Patient states he was told by his doctor to come to the ED because of irregular readings from his halter monitor.  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  He reports that he has had no symptoms besides a fluttery feeling recently.  He denies dizziness, pain, weakness.  He does not appear to be in acute distress.  He requested a blanket but expresses no other needs at this time.

## 2019-05-28 NOTE — ED Triage Notes (Signed)
Pt states palpitations that began on Saturday, was seen by heart dr today, was given Holter monitor, pt states ems took it off when they picked him up from home today, pt called EMS due to increase in palpitations. Pt denies cp, states EMS saw PVC'S when they did ekg on him earlier. Pt in no distress, denies cp.

## 2019-05-28 NOTE — ED Provider Notes (Signed)
[facility] Emergency Department Provider Note   ____________________________________________   First MD Initiated Contact with Patient 05/28/19 2312     (approximate)  I have reviewed the triage vital signs and the nursing notes.   HISTORY  Chief Complaint Irregular Heart Beat    HPI Douglas Phelps is a 78 y.o. male who presents to the ED from home via EMS with a chief complaint of palpitations. Patient has a history of CAD s/p CABG who saw his cardiologist Dr. Nehemiah Massed in the office this afternoon for palpitations x2 weeks with increased frequency occurring intermittently associated with a fluttering sensation.  Holter was placed x1 week and patient will have a stress test and an echo.  Patient called EMS for prolonged period of palpitations which were not associated with diaphoresis, chest pain, shortness of breath, dizziness, nausea/vomiting.  Patient denies recent fever, cough, abdominal pain, diarrhea.  Denies recent travel or trauma.       Past Medical History:  Diagnosis Date  . GERD (gastroesophageal reflux disease)   . High cholesterol   . Hx of CABG   . Hypertension   . Renal disorder   . SI (sacroiliac) joint dysfunction   . Thyroid disease     Patient Active Problem List   Diagnosis Date Noted  . DDD (degenerative disc disease), lumbosacral 08/27/2018  . Bilateral carotid artery stenosis 02/20/2017  . High cholesterol 01/20/2017  . Hypertension 01/20/2017  . Hx of CABG 01/20/2017  . Carotid stenosis 01/20/2017  . Gastroesophageal reflux disease with hiatal hernia 05/03/2016  . Abnormal barium swallow 05/03/2016  . CKD (chronic kidney disease) stage 3, GFR 30-59 ml/min 07/30/2015  . Hyperglycemia 03/28/2014  . Moderate mitral insufficiency 03/21/2014  . Malignant neoplasm of prostate ([place]) 10/11/2013  . ASCVD (arteriosclerotic cardiovascular disease) 10/11/2013  . Hypothyroidism 10/11/2013    Past Surgical History:    Procedure Laterality Date  . APPENDECTOMY    . CARDIAC SURGERY     CABG x 3  06/2000  . COLON SURGERY    . PROSTATE SURGERY      Prior to Admission medications   Medication Sig Start Date End Date Taking? Authorizing Provider  acetaminophen (TYLENOL) 500 MG tablet Take 500 mg by mouth every 6 (six) hours as needed.    [provider]  amLODipine (NORVASC) 5 MG tablet Take 5 mg by mouth 2 (two) times daily.  02/15/16   [provider]  Ascorbic Acid (VITAMIN C) 1000 MG tablet Take 1,000 mg by mouth daily.     [provider]  aspirin EC 81 MG tablet Take 81 mg by mouth daily.     [provider]  atorvastatin (LIPITOR) 10 MG tablet Take 10 mg by mouth daily at 6 PM.  02/04/16 07/24/18  [provider]  B Complex Vitamins (VITAMIN-B COMPLEX) TABS Take 1 tablet by mouth daily.  06/11/12   [provider]  calcium carbonate (CALCIUM 600) 600 MG TABS tablet Take 600 mg by mouth 2 (two) times daily with a meal.  06/11/12   [provider]  esomeprazole (NEXIUM) 40 MG capsule Take 40 mg by mouth every other day.  07/08/14   [provider]  famotidine (PEPCID) 20 MG tablet Take 20 mg by mouth 2 (two) times daily.    [provider]  gabapentin (NEURONTIN) 100 MG capsule Take 100 mg by mouth 3 (three) times daily.    [provider]  levothyroxine (SYNTHROID) 125 MCG tablet Take  125 mcg by mouth daily before breakfast.  10/06/14   [provider]  loratadine (CLARITIN) 10 MG tablet Take 10 mg by mouth daily as needed for allergies.    [provider]  losartan (COZAAR) 100 MG tablet Take 100 mg by mouth daily.  01/06/17 07/24/18  [provider]  Magnesium Oxide 500 MG TABS Take 500 mg by mouth daily.     [provider]  metoprolol succinate (TOPROL-XL) 50 MG 24 hr tablet Take 50 mg by mouth 2 (two) times daily.  06/11/12   [provider]  Multiple Vitamin (MULTI-VITAMINS)  TABS Take 1 tablet by mouth daily.     [provider]  omega-3 acid ethyl esters (LOVAZA) 1 g capsule TAKE 1 CAPSULE (1 G TOTAL) BY MOUTH 2 (TWO) TIMES DAILY. 08/22/16   [provider]  sildenafil (REVATIO) 20 MG tablet 5 tabs po QHS as needed 12/08/15   [provider]  traMADol (ULTRAM) 50 MG tablet Take 50 mg by mouth every 6 (six) hours as needed.    [provider]  Vitamin D, Cholecalciferol, 400 units TABS Take 400 Units by mouth daily.  06/11/12   [provider]    Allergies Benazepril, Hctz [hydrochlorothiazide], Hydralazine hcl, and Omeprazole  No family history on file.  Social History Social History   Tobacco Use  . Smoking status: Never Smoker  . Smokeless tobacco: Never Used  Substance Use Topics  . Alcohol use: No  . Drug use: No    Review of Systems  Constitutional: No fever/chills Eyes: No visual changes. ENT: No sore throat. Cardiovascular: Positive for palpitations.  Denies chest pain. Respiratory: Denies shortness of breath. Gastrointestinal: No abdominal pain.  No nausea, no vomiting.  No diarrhea.  No constipation. Genitourinary: Negative for dysuria. Musculoskeletal: Negative for back pain. Skin: Negative for rash. Neurological: Negative for headaches, focal weakness or numbness.   ____________________________________________   PHYSICAL EXAM:  VITAL SIGNS: ED Triage Vitals  Enc Vitals Group     BP 05/28/19 1742 (!) 157/61     Pulse Rate 05/28/19 1742 74     Resp 05/28/19 1742 16     Temp 05/28/19 1742 98.7 F (37.1 C)     Temp Source 05/28/19 1742 Oral     SpO2 05/28/19 1742 98 %     Weight 05/28/19 1743 165 lb (74.8 kg)     Height 05/28/19 1743 5\' 6"  (1.676 m)     Head Circumference --      Peak Flow --      Pain Score 05/28/19 1743 0     Pain Loc --      Pain Edu? --      Excl. in [place]? --     Constitutional: Alert and oriented. Well appearing and in no acute distress. Eyes: Conjunctivae  are normal. PERRL. EOMI. Head: Atraumatic. Nose: No congestion/rhinnorhea. Mouth/Throat: Mucous membranes are moist.  Oropharynx non-erythematous. Neck: No stridor.   Cardiovascular: Normal rate, regular rhythm. Grossly normal heart sounds.  Good peripheral circulation. Respiratory: Normal respiratory effort.  No retractions. Lungs CTAB. Gastrointestinal: Soft and nontender. No distention. No abdominal bruits. No CVA tenderness. Musculoskeletal: No lower extremity tenderness nor edema.  No joint effusions. Neurologic:  Normal speech and language. No gross focal neurologic deficits are appreciated. No gait instability. Skin:  Skin is warm, dry and intact. No rash noted. Psychiatric: Mood and affect are normal. Speech and behavior are normal.  ____________________________________________   LABS (all labs ordered are  listed, but only abnormal results are displayed)  Labs Reviewed  BASIC METABOLIC PANEL - Abnormal; Notable for the following components:      Result Value   Sodium 131 (*)    Glucose, Bld 136 (*)    BUN 24 (*)    Creatinine, Ser 1.39 (*)    GFR calc non Af Amer 49 (*)    GFR calc Af Amer 56 (*)    All other components within normal limits  CBC - Abnormal; Notable for the following components:   RBC 3.74 (*)    Hemoglobin 12.5 (*)    HCT 36.3 (*)    All other components within normal limits  TSH  T4, FREE  BRAIN NATRIURETIC PEPTIDE  TROPONIN I (HIGH SENSITIVITY)  TROPONIN I (HIGH SENSITIVITY)   ____________________________________________  EKG  ED ECG REPORT I, Dondre Catalfamo J, the attending physician, personally viewed and interpreted this ECG.   Date: 05/29/2019  EKG Time: 1745  Rate: 79  Rhythm: normal EKG, normal sinus rhythm  Axis: LAD  Intervals:Frequent PVCs  ST&T Change: Nonspecific  ED ECG REPORT I, Meaghan Whistler J, the attending physician, personally viewed and interpreted this ECG.   Date: 05/29/2019  EKG Time: 1745  Rate: 77  Rhythm: normal  EKG, normal sinus rhythm  Axis: LAD  Intervals:none  ST&T Change: Nonspecific   ____________________________________________  RADIOLOGY  ED MD interpretation: No acute cardiopulmonary process  Official radiology report(s): DG Chest 2 View  Result Date: 05/28/2019 CLINICAL DATA:  Palpitation skull, PVCs EXAM: CHEST - 2 VIEW COMPARISON:  Multiple prior studies most recent 05/08/2018 FINDINGS: Cardiomediastinal contours are stable with changes of median sternotomy for CABG. Signs of thoracic aortic and abdominal aortic atherosclerotic changes similar to prior studies. Lungs are clear. No signs of pleural effusion. Visualized skeletal structures are unremarkable. Signs of hiatal hernia in the retrocardiac region as on prior studies. IMPRESSION: No active cardiopulmonary disease. Stable appearance of the chest compared to prior study. Electronically Signed   By: Zetta Bills M.D.   On: 05/28/2019 18:34    ____________________________________________   PROCEDURES  Procedure(s) performed (including Critical Care):  Procedures   ____________________________________________   INITIAL IMPRESSION / ASSESSMENT AND PLAN / ED COURSE  As part of my medical decision making, I reviewed the following data within the [facility] notes reviewed and incorporated, Labs reviewed, EKG interpreted, Old chart reviewed, Radiograph reviewed and Notes from prior ED visits     ALANO KYRIACOU was evaluated in Emergency Department on 05/29/2019 for the symptoms described in the history of present illness. He was evaluated in the context of the global COVID-19 pandemic, which necessitated consideration that the patient might be at risk for infection with the SARS-CoV-2 virus that causes COVID-19. Institutional protocols and algorithms that pertain to the evaluation of patients at risk for COVID-19 are in a state of rapid change based on information released by regulatory bodies  including the CDC and federal and state organizations. These policies and algorithms were followed during the patient's care in the ED.    78 year old male who presents with palpitations. Differential diagnosis includes, but is not limited to, ACS, aortic dissection, pulmonary embolism, cardiac tamponade, pneumothorax, pneumonia, pericarditis, myocarditis, GI-related causes including esophagitis/gastritis, and musculoskeletal chest wall pain.    Initial troponin unremarkable.  EKG demonstrates frequent PVCs.  Will repeat time troponin, check thyroid panel and reassess.   Clinical Course as of May 28 50  Wed May 29, 2019  0049 Patient resting in  no acute distress.  No complaints.  Updated him of all test results.  Discussed with his cardiologist Dr. Nehemiah Massed.  Verified that patient is taking Toprol-XL 50 mg twice daily.  No medication adjustments recommended at this time.  Will follow up with his cardiologist as scheduled.  Strict return precautions given.  Patient verbalizes understanding and agrees with plan of care.   [JS]    Clinical Course User Index [JS] Paulette Blanch, MD     ____________________________________________   FINAL CLINICAL IMPRESSION(S) / ED DIAGNOSES  Final diagnoses:  Heart palpitations  PVC's (premature ventricular contractions)     ED Discharge Orders    None       Note:  This document was prepared using Dragon voice recognition software and may include unintentional dictation errors.   Paulette Blanch, MD 05/29/19 [id number]

## 2019-05-28 NOTE — ED Notes (Signed)
Pt states low Na at times.

## 2019-05-29 LAB — TROPONIN I (HIGH SENSITIVITY): Troponin I (High Sensitivity): 11 ng/L (ref <18)

## 2019-05-29 LAB — BRAIN NATRIURETIC PEPTIDE: B Natriuretic Peptide: 71 pg/mL (ref 0.0–100.0)

## 2019-05-29 LAB — TSH: TSH: 1.03 u[IU]/mL (ref 0.350–4.500)

## 2019-05-29 LAB — T4, FREE: Free T4: 0.95 ng/dL (ref 0.61–1.12)

## 2019-05-29 NOTE — ED Notes (Signed)
Assisted pt with urinal

## 2019-05-29 NOTE — Discharge Instructions (Addendum)
Please wear your Holter monitor as instructed and follow-up with your cardiologist as scheduled.  Return to the ER for worsening symptoms, persistent vomiting, difficulty breathing or other concerns.

## 2019-06-07 ENCOUNTER — Other Ambulatory Visit: Payer: Self-pay | Admitting: Internal Medicine

## 2019-06-07 DIAGNOSIS — R1031 Right lower quadrant pain: Secondary | ICD-10-CM

## 2019-06-18 DIAGNOSIS — T466X5A Adverse effect of antihyperlipidemic and antiarteriosclerotic drugs, initial encounter: Secondary | ICD-10-CM | POA: Insufficient documentation

## 2019-06-19 ENCOUNTER — Other Ambulatory Visit: Payer: Self-pay

## 2019-06-19 ENCOUNTER — Ambulatory Visit
Admission: RE | Admit: 2019-06-19 | Discharge: 2019-06-19 | Disposition: A | Discharge status: Home / Self Care | Payer: Medicare PPO | Source: Ambulatory Visit | Attending: Internal Medicine | Admitting: Internal Medicine

## 2019-06-19 DIAGNOSIS — R1031 Right lower quadrant pain: Secondary | ICD-10-CM | POA: Insufficient documentation

## 2019-07-26 ENCOUNTER — Ambulatory Visit (INDEPENDENT_AMBULATORY_CARE_PROVIDER_SITE_OTHER): Payer: Medicare Other | Admitting: Vascular Surgery

## 2019-07-26 ENCOUNTER — Encounter (INDEPENDENT_AMBULATORY_CARE_PROVIDER_SITE_OTHER): Payer: Medicare Other

## 2019-07-30 ENCOUNTER — Ambulatory Visit (INDEPENDENT_AMBULATORY_CARE_PROVIDER_SITE_OTHER): Payer: Medicare PPO | Admitting: Vascular Surgery

## 2019-07-30 ENCOUNTER — Other Ambulatory Visit: Payer: Self-pay

## 2019-07-30 ENCOUNTER — Ambulatory Visit (INDEPENDENT_AMBULATORY_CARE_PROVIDER_SITE_OTHER): Payer: Medicare PPO

## 2019-07-30 ENCOUNTER — Encounter (INDEPENDENT_AMBULATORY_CARE_PROVIDER_SITE_OTHER): Payer: Self-pay | Admitting: Vascular Surgery

## 2019-07-30 VITALS — BP 137/68 | HR 71 | Resp 16 | Ht 66.0 in | Wt 175.0 lb

## 2019-07-30 DIAGNOSIS — I10 Essential (primary) hypertension: Secondary | ICD-10-CM

## 2019-07-30 DIAGNOSIS — I6523 Occlusion and stenosis of bilateral carotid arteries: Secondary | ICD-10-CM

## 2019-07-30 DIAGNOSIS — Z951 Presence of aortocoronary bypass graft: Secondary | ICD-10-CM

## 2019-07-30 DIAGNOSIS — E7800 Pure hypercholesterolemia, unspecified: Secondary | ICD-10-CM | POA: Diagnosis not present

## 2019-07-30 NOTE — Assessment & Plan Note (Signed)
Carotid duplex shows 1 to 39% bilateral carotid artery stenosis without significant progression from his previous study.  He has had to stop his statin secondary to myalgias and leg pain which has helped his leg pain.  He remains on aspirin therapy.  We will continue to follow this on an annual basis.

## 2019-07-30 NOTE — Patient Instructions (Signed)
Carotid Artery Disease  Carotid artery disease, also called carotid artery stenosis, is the narrowing or blockage of one or both carotid arteries. The carotid arteries are the two main blood vessels on either side of the neck. They supply blood to the brain, other parts of the head, and the neck. Carotid artery disease increases your risk for a stroke or a transient ischemic attack (TIA). A TIA is a "mini-stroke" that causes stroke-like symptoms that then go away quickly. What are the causes? This condition is mainly caused by a narrowing and hardening of the carotid arteries (atherosclerosis). The carotid arteries can become narrow or clogged with a buildup of fat, cholesterol, calcium, and other substances (plaque). What increases the risk? The following factors may make you more likely to develop this condition:  Having certain medical conditions, such as: ? High cholesterol. ? High blood pressure (hypertension). ? Diabetes. ? Obesity.  Smoking.  A family history of cardiovascular disease.  Inactivity or lack of regular exercise.  Being male. Men have an increased risk of developing atherosclerosis earlier in life than women.  Old age. What are the signs or symptoms? This condition may not have any signs or symptoms until a stroke or TIA occurs. In some cases, your health care provider may be able to hear a whooshing sound (bruit). This can indicate a change in blood flow caused by plaque buildup. An eye exam can also help identify signs of the condition. How is this diagnosed? This condition may be diagnosed with a physical exam, your medical history, and your family's medical history. You may also have tests that look at the blood flow in your carotid arteries, such as:  Carotid artery ultrasound, which uses sound waves to create pictures to show if the arteries are narrow or blocked.  Tests that use a dye injected into a vein to highlight your arteries on images, such  as: ? Carotid or cerebral angiography, which uses X-rays. ? Computerized tomographic angiography (CTA), which uses CT scans. ? Magnetic resonance angiography (MRA), which uses MRI. How is this treated? This condition may be treated with a combination of treatments. Treatment options include:  Lifestyle changes, such as: ? Quitting smoking. ? Exercising regularly or as told by your health care provider. ? Eating a heart-healthy diet. ? Managing stress. ? Maintaining a healthy weight.  Medicines to control blood pressure, cholesterol, and blood clotting.  Surgery. You may have: ? A carotid endarterectomy. This is a surgery to remove the blockages in the carotid arteries. ? A carotid angioplasty with stenting. This is a procedure in which a small mesh tube (stent) is used to widen the blocked carotid arteries. Follow these instructions at home: Eating and drinking Follow instructions about your diet from your health care provider. It is important to:  Eat a healthy diet that is low in saturated fats and includes plenty of fresh fruits, vegetables, and lean meats.  Avoid foods that are high in fat and salt (sodium).  Avoid foods that are fried, overly processed, or have poor nutritional value.  Lifestyle   Maintain a healthy weight.  Do exercises as told by your health care provider to stay physically active. It is recommended that each week you get at least 150 minutes of moderate-intensity exercise or 75 minutes of exercise that takes a lot of effort.  Do not use any products that contain nicotine or tobacco, such as cigarettes, e-cigarettes, and chewing tobacco. If you need help quitting, ask your health care provider.    Do not drink alcohol if: ? Your health care provider tells you not to drink. ? You are pregnant, may be pregnant, or are planning to become pregnant.  If you drink alcohol: ? Limit how much you use to:  0-1 drink a day for women.  0-2 drinks a day for  men. ? Be aware of how much alcohol is in your drink. In the [place] one drink equals one 12 oz bottle of beer (355 mL), one 5 oz glass of wine (148 mL), or one 1 oz glass of hard liquor (44 mL).  Do not use drugs.  Manage your stress. Ask your health care provider for stress management tips. General instructions  Take over-the-counter and prescription medicines only as told by your health care provider.  Keep all follow-up visits as told by your health care provider. This is important. Where to find more information  American Heart Association: www.heart.org Get help right away if:  You have any symptoms of a stroke. "BE FAST" is an easy way to remember the main warning signs of a stroke: ? B - Balance. Signs are dizziness, sudden trouble walking, or loss of balance. ? E - Eyes. Signs are trouble seeing or a sudden change in vision. ? F - Face. Signs are sudden weakness or numbness of the face, or the face or eyelid drooping on one side. ? A - Arms. Signs are weakness or numbness in an arm. This happens suddenly and usually on one side of the body. ? S - Speech. Signs are sudden trouble speaking, slurred speech, or trouble understanding what people say. ? T - Time. Time to call emergency services. Write down what time symptoms started.  You have other signs of a stroke, such as: ? A sudden, severe headache with no known cause. ? Nausea or vomiting. ? Seizure. These symptoms may represent a serious problem that is an emergency. Do not wait to see if the symptoms will go away. Get medical help right away. Call your local emergency services (911 in the [place] Do not drive yourself to the hospital. Summary  Carotid artery disease, also called carotid artery stenosis, is the narrowing or blockage of one or both carotid arteries.  Carotid artery disease increases your risk for a stroke or a transient ischemic attack (TIA).  This condition can be treated with lifestyle changes, medicines,  surgery, or a combination of these treatments.  Get help right away if you have any symptoms of stroke. The acronym BEFAST is an easy way to remember the main warning signs of stroke. This information is not intended to replace advice given to you by your health care provider. Make sure you discuss any questions you have with your health care provider. Document Revised: 11/26/2018 Document Reviewed: 11/26/2018 [employer]vier Patient Education  2020 [employer]  

## 2019-07-30 NOTE — Progress Notes (Signed)
MRN : [id number]  Douglas Phelps is a 79 y.o. (03/12/1941) male who presents with chief complaint of  Chief Complaint  Patient presents with  . Follow-up    ultrasound follow up   .  History of Present Illness: Patient returns in follow-up of his carotid disease.  He is doing well today without focal neurologic symptoms. Specifically, the patient denies amaurosis fugax, speech or swallowing difficulties, or arm or leg weakness or numbness.  He is having a lot of back and leg pain and numbness and is being treated for neuropathy.  His carotid duplex today shows stable 1 to 39% ICA stenosis bilaterally without significant progression from previous studies.  Current Outpatient Medications  Medication Sig Dispense Refill  . acetaminophen (TYLENOL) 500 MG tablet Take 500 mg by mouth every 6 (six) hours as needed.    Marland Kitchen amLODipine (NORVASC) 5 MG tablet Take 5 mg by mouth 2 (two) times daily.     . Ascorbic Acid (VITAMIN C) 1000 MG tablet Take 1,000 mg by mouth daily.     Marland Kitchen aspirin EC 81 MG tablet Take 81 mg by mouth daily.     . B Complex Vitamins (VITAMIN-B COMPLEX) TABS Take 1 tablet by mouth daily.     . calcium carbonate (CALCIUM 600) 600 MG TABS tablet Take 600 mg by mouth 2 (two) times daily with a meal.     . esomeprazole (NEXIUM) 40 MG capsule Take 40 mg by mouth every other day.     . famotidine (PEPCID) 20 MG tablet Take 20 mg by mouth 2 (two) times daily.    Marland Kitchen gabapentin (NEURONTIN) 100 MG capsule Take 100 mg by mouth 3 (three) times daily.    Marland Kitchen levothyroxine (SYNTHROID) 125 MCG tablet Take 125 mcg by mouth daily before breakfast.     . loratadine (CLARITIN) 10 MG tablet Take 10 mg by mouth daily as needed for allergies.    . Magnesium Oxide 500 MG TABS Take 500 mg by mouth daily.     . metoprolol succinate (TOPROL-XL) 50 MG 24 hr tablet Take 50 mg by mouth 2 (two) times daily.     . Multiple Vitamin (MULTI-VITAMINS) TABS Take 1 tablet by mouth daily.     Marland Kitchen omega-3 acid ethyl  esters (LOVAZA) 1 g capsule TAKE 1 CAPSULE (1 G TOTAL) BY MOUTH 2 (TWO) TIMES DAILY.    . sildenafil (REVATIO) 20 MG tablet 5 tabs po QHS as needed    . traMADol (ULTRAM) 50 MG tablet Take 50 mg by mouth every 6 (six) hours as needed.    . Vitamin D, Cholecalciferol, 400 units TABS Take 400 Units by mouth daily.     Marland Kitchen atorvastatin (LIPITOR) 10 MG tablet Take 10 mg by mouth daily at 6 PM.     . losartan (COZAAR) 100 MG tablet Take 100 mg by mouth daily.      No current facility-administered medications for this visit.    Past Medical History:  Diagnosis Date  . GERD (gastroesophageal reflux disease)   . High cholesterol   . Hx of CABG   . Hypertension   . Renal disorder   . SI (sacroiliac) joint dysfunction   . Thyroid disease     Past Surgical History:  Procedure Laterality Date  . APPENDECTOMY    . CARDIAC SURGERY     CABG x 3  06/2000  . COLON SURGERY    . PROSTATE SURGERY      Family History  No bleeding disorders, clotting disorders, autoimmune diseases, or aneurysms  Social History     Social History  Substance Use Topics  . Smoking status: Never Smoker  . Smokeless tobacco: Never Used  . Alcohol use No  No IVDU       Allergies  Allergen Reactions  . Lotensin [Benazepril] Other (See Comments)    Other reaction(s): Other (See Comments) cough Cough   . Prilosec [Omeprazole] Other (See Comments)    Other reaction(s): Other (See Comments) headache headache  . Benadryl [Diphenhydramine Hcl (Sleep)] Other (See Comments)    Jittery   . Hydralazine Hcl     Other reaction(s): Kidney Disorder      REVIEW OF SYSTEMS(Negative unless checked)  Constitutional: [] ??Weight loss[] ??Fever[] ??Chills Cardiac:[] ??Chest pain[] ??Chest pressure[] ??Palpitations [] ??Shortness of breath when laying flat [] ??Shortness of breath at rest [] ??Shortness of breath with exertion. Vascular: [] ??Pain in legs with walking[] ??Pain in  legsat rest[] ??Pain in legs when laying flat [] ??Claudication [] ??Pain in feet when walking [] ??Pain in feet at rest [] ??Pain in feet when laying flat [] ??History of DVT [] ??Phlebitis [] ??Swelling in legs [] ??Varicose veins [] ??Non-healing ulcers Pulmonary: [] ??Uses home oxygen [] ??Productive cough[] ??Hemoptysis [] ??Wheeze [] ??COPD [] ??Asthma Neurologic: [] ??Dizziness [] ??Blackouts [] ??Seizures [] ??History of stroke [] ??History of TIA[] ??Aphasia [] ??Temporary blindness[] ??Dysphagia [] ??Weaknessor numbness in arms [] ??Weakness or numbnessin legs Musculoskeletal: [x] ??Arthritis [] ??Joint swelling [] ??Joint pain [] ??Low back pain Hematologic:[] ??Easy bruising[] ??Easy bleeding [] ??Hypercoagulable state [] ??Anemic [] ??Hepatitis Gastrointestinal:[] ??Blood in stool[] ??Vomiting blood[] ??Gastroesophageal reflux/heartburn[] ??Abdominal pain Genitourinary: [] ??Chronic kidney disease [] ??Difficulturination [] ??Frequenturination [] ??Burning with urination[] ??Hematuria Skin: [] ??Rashes [] ??Ulcers [] ??Wounds Psychological: [] ??History of anxiety[] ??History of major depression.     Physical Examination  Vitals:   07/30/19 0853  BP: 137/68  Pulse: 71  Resp: 16  Weight: 175 lb (79.4 kg)  Height: 5\' 6"  (1.676 m)   Body mass index is 28.25 kg/m. Gen:  WD/WN, NAD Head: [place]/AT, No temporalis wasting. Ear/Nose/Throat: Hearing grossly intact, nares w/o erythema or drainage, trachea midline Eyes: Conjunctiva clear. Sclera non-icteric Neck: Supple.  No carotid bruit  Pulmonary:  Good air movement, equal and clear to auscultation bilaterally.  Cardiac: RRR, No JVD Vascular:  Vessel Right Left  Radial Palpable Palpable   Musculoskeletal: M/S 5/5 throughout.  No deformity or atrophy.  No significant lower extremity edema. Neurologic: CN 2-12 intact. Sensation grossly intact in extremities.  Symmetrical.  Speech  is fluent. Motor exam as listed above. Psychiatric: Judgment intact, Mood & affect appropriate for pt's clinical situation. Dermatologic: No rashes or ulcers noted.  No cellulitis or open wounds.      CBC Lab Results  Component Value Date   WBC 6.4 05/28/2019   HGB 12.5 (L) 05/28/2019   HCT 36.3 (L) 05/28/2019   MCV 97.1 05/28/2019   PLT 165 05/28/2019    BMET    Component Value Date/Time   NA 131 (L) 05/28/2019 1750   NA 134 (L) 01/26/2013 1511   K 4.3 05/28/2019 1750   K 4.8 01/26/2013 1511   CL 98 05/28/2019 1750   CL 103 01/26/2013 1511   CO2 23 05/28/2019 1750   CO2 25 01/26/2013 1511   GLUCOSE 136 (H) 05/28/2019 1750   GLUCOSE 133 (H) 01/26/2013 1511   BUN 24 (H) 05/28/2019 1750   BUN 30 (H) 01/26/2013 1511   CREATININE 1.39 (H) 05/28/2019 1750   CREATININE 1.52 (H) 01/26/2013 1511   CALCIUM 9.5 05/28/2019 1750   CALCIUM 9.3 01/26/2013 1511   GFRNONAA 49 (L) 05/28/2019 1750   GFRNONAA 45 (L) 01/26/2013 1511   GFRAA 56 (L) 05/28/2019 1750   GFRAA 53 (L) 01/26/2013 1511  CrCl cannot be calculated (Patient's most recent lab result is older than the maximum 21 days allowed.).  COAG No results found for: INR, PROTIME  Radiology VAS US CAROTID  Result Date: 07/30/2019 Carotid Arterial Duplex Study Indications:       Carotid stenosis. Comparison Study:  07/24/2018 Performing Technologist: Concha Norway RVT  Examination Guidelines: A complete evaluation includes B-mode imaging, spectral Doppler, color Doppler, and power Doppler as needed of all accessible portions of each vessel. Bilateral testing is considered an integral part of a complete examination. Limited examinations for reoccurring indications may be performed as noted.  Right Carotid Findings: +----------+--------+--------+--------+------------------+--------+           PSV cm/sEDV cm/sStenosisPlaque DescriptionComments +----------+--------+--------+--------+------------------+--------+ CCA Prox  113      13                                         +----------+--------+--------+--------+------------------+--------+ CCA Mid   101     12                                         +----------+--------+--------+--------+------------------+--------+ CCA Distal96      14                                         +----------+--------+--------+--------+------------------+--------+ ICA Prox  61      16                                         +----------+--------+--------+--------+------------------+--------+ ICA Mid   119     19      1-39%   calcific                   +----------+--------+--------+--------+------------------+--------+ ICA Distal113     25                                         +----------+--------+--------+--------+------------------+--------+ ECA       134     3                                          +----------+--------+--------+--------+------------------+--------+ +----------+--------+-------+----------------+-------------------+           PSV cm/sEDV cmsDescribe        Arm Pressure (mmHG) +----------+--------+-------+----------------+-------------------+ [id number]            Multiphasic, WNL                    +----------+--------+-------+----------------+-------------------+ +---------+--------+--+--------+---------+ VertebralPSV cm/s56EDV cm/sAntegrade +---------+--------+--+--------+---------+  Left Carotid Findings: +----------+--------+--------+--------+------------------+--------+           PSV cm/sEDV cm/sStenosisPlaque DescriptionComments +----------+--------+--------+--------+------------------+--------+ CCA Prox  118     17                                         +----------+--------+--------+--------+------------------+--------+ CCA  Mid   118     15                                         +----------+--------+--------+--------+------------------+--------+ CCA Distal116     15                                          +----------+--------+--------+--------+------------------+--------+ ICA Prox  109     23      1-39%   calcific                   +----------+--------+--------+--------+------------------+--------+ ICA Mid   112     22                                         +----------+--------+--------+--------+------------------+--------+ ICA Distal115     18                                         +----------+--------+--------+--------+------------------+--------+ ECA       161     0                                          +----------+--------+--------+--------+------------------+--------+ +----------+--------+--------+----------------+-------------------+           PSV cm/sEDV cm/sDescribe        Arm Pressure (mmHG) +----------+--------+--------+----------------+-------------------+ Subclavian212             Multiphasic, WNL                    +----------+--------+--------+----------------+-------------------+ +---------+--------+--+--------+---------+ VertebralPSV cm/s37EDV cm/sAntegrade +---------+--------+--+--------+---------+   Summary: Right Carotid: Velocities in the right ICA are consistent with a 1-39% stenosis.                The ECA appears <50% stenosed. Mild calcific plaque in the                bulb/proximal ICA. Left Carotid: Velocities in the left ICA are consistent with a 1-39% stenosis.               The ECA appears <50% stenosed. Mild to moderate calcific plaque in               the bulb/proximal ICA. Vertebrals:  Bilateral vertebral arteries demonstrate antegrade flow. Subclavians: Normal flow hemodynamics were seen in bilateral subclavian              arteries. *See table(s) above for measurements and observations.  Electronically signed by Leotis Pain MD on 07/30/2019 at 9:03:11 AM.    Final      Assessment/Plan High cholesterol lipid control important in reducing the progression of atherosclerotic disease. Continue statin therapy    Hypertension blood pressure control important in reducing the progression of atherosclerotic disease. On appropriate oral medications.   Hx of CABG No recent anginal symptoms  Carotid stenosis Carotid duplex shows 1 to 39% bilateral carotid artery stenosis without significant progression from his previous study.  He has had to stop his statin secondary  to myalgias and leg pain which has helped his leg pain.  He remains on aspirin therapy.  We will continue to follow this on an annual basis.    Leotis Pain, MD  07/30/2019 9:51 AM    This note was created with Dragon medical transcription system.  Any errors from dictation are purely unintentional

## 2019-08-19 ENCOUNTER — Ambulatory Visit (INDEPENDENT_AMBULATORY_CARE_PROVIDER_SITE_OTHER): Payer: Medicare PPO | Admitting: Dermatology

## 2019-08-19 ENCOUNTER — Encounter: Payer: Self-pay | Admitting: Dermatology

## 2019-08-19 ENCOUNTER — Other Ambulatory Visit: Payer: Self-pay

## 2019-08-19 DIAGNOSIS — L578 Other skin changes due to chronic exposure to nonionizing radiation: Secondary | ICD-10-CM | POA: Diagnosis not present

## 2019-08-19 DIAGNOSIS — L918 Other hypertrophic disorders of the skin: Secondary | ICD-10-CM

## 2019-08-19 DIAGNOSIS — D1800 Hemangioma unspecified site: Secondary | ICD-10-CM | POA: Diagnosis not present

## 2019-08-19 DIAGNOSIS — L821 Other seborrheic keratosis: Secondary | ICD-10-CM

## 2019-08-19 DIAGNOSIS — L820 Inflamed seborrheic keratosis: Secondary | ICD-10-CM

## 2019-08-19 DIAGNOSIS — D2230 Melanocytic nevi of unspecified part of face: Secondary | ICD-10-CM

## 2019-08-19 DIAGNOSIS — L814 Other melanin hyperpigmentation: Secondary | ICD-10-CM | POA: Diagnosis not present

## 2019-08-19 DIAGNOSIS — D229 Melanocytic nevi, unspecified: Secondary | ICD-10-CM

## 2019-08-19 DIAGNOSIS — L570 Actinic keratosis: Secondary | ICD-10-CM | POA: Diagnosis not present

## 2019-08-19 DIAGNOSIS — D225 Melanocytic nevi of trunk: Secondary | ICD-10-CM

## 2019-08-19 NOTE — Progress Notes (Signed)
   Follow-Up Visit   Subjective  Douglas Phelps is a 79 y.o. male who presents for the following: Annual Exam (pt states that he has no hx of skin CA but does have a hx of AK's which have been treated with LN2 in the past).   The following portions of the chart were reviewed this encounter and updated as appropriate:     Review of Systems: No other skin or systemic complaints.  Objective  Well appearing patient in no apparent distress; mood and affect are within normal limits.  A full examination was performed including scalp, head, eyes, ears, nose, lips, neck, chest, axillae, abdomen, back, buttocks, bilateral upper extremities, bilateral lower extremities, hands, feet, fingers, toes, fingernails, and toenails. All findings within normal limits unless otherwise noted below.  Objective  Scalp and face (11): Erythematous thin papules/macules with gritty scale.   Objective  Face, trunk, extremities: Diffuse scaly erythematous macules with underlying dyspigmentation.   Objective  Trunk, extremities: Red papules.   Objective  R elbow x 1, L dorsal hand x 1 (2): Erythematous keratotic or waxy stuck-on papule or plaque.   Objective  Face, trunk, extremities: Scattered tan macules.   Objective  Face, trunk, extremities: Tan-brown and/or pink-flesh-colored symmetric macules and papules.   Objective  Face, trunk, extremities: Stuck-on, waxy, tan-brown papule or plaque --Discussed benign etiology and prognosis.   Objective  B/L axilla: Fleshy, skin-colored pedunculated papules.    Assessment & Plan  AK (actinic keratosis) (11) Scalp and face  Destruction of lesion - Scalp and face Complexity: simple   Destruction method: cryotherapy   Informed consent: discussed and consent obtained   Timeout:  patient name, date of birth, surgical site, and procedure verified Lesion destroyed using liquid nitrogen: Yes   Region frozen until ice ball extended beyond lesion: Yes     Outcome: patient tolerated procedure well with no complications   Post-procedure details: wound care instructions given    Actinic skin damage Face, trunk, extremities  Recommend daily broad spectrum sunscreen SPF 30+ to sun-exposed areas, and photoprotection daily.   Hemangioma, unspecified site Trunk, extremities  Benign, observe.  Inflamed seborrheic keratosis (2) R elbow x 1, L dorsal hand x 1  Destruction of lesion - R elbow x 1, L dorsal hand x 1 Complexity: simple   Destruction method: cryotherapy   Informed consent: discussed and consent obtained   Timeout:  patient name, date of birth, surgical site, and procedure verified Lesion destroyed using liquid nitrogen: Yes   Region frozen until ice ball extended beyond lesion: Yes   Outcome: patient tolerated procedure well with no complications   Post-procedure details: wound care instructions given    Lentigines Face, trunk, extremities  Benign, observe.  Nevus Face, trunk, extremities  Benign, observe.  Seborrheic keratosis Face, trunk, extremities  Benign, observe.  Skin tag B/L axilla  Benign, observe.  Return in about 1 year (around 08/18/2020) for TBSE - hx AK's, ISK's .   Tanja Port, MD, am acting as scribe for Sarina Ser, MD .

## 2019-09-17 ENCOUNTER — Other Ambulatory Visit: Payer: Self-pay

## 2019-09-17 ENCOUNTER — Ambulatory Visit: Payer: Medicare PPO | Attending: Neurology

## 2019-09-17 DIAGNOSIS — M6281 Muscle weakness (generalized): Secondary | ICD-10-CM | POA: Diagnosis present

## 2019-09-17 DIAGNOSIS — G8929 Other chronic pain: Secondary | ICD-10-CM | POA: Insufficient documentation

## 2019-09-17 DIAGNOSIS — R262 Difficulty in walking, not elsewhere classified: Secondary | ICD-10-CM | POA: Insufficient documentation

## 2019-09-17 DIAGNOSIS — M545 Low back pain: Secondary | ICD-10-CM | POA: Insufficient documentation

## 2019-09-17 DIAGNOSIS — M5416 Radiculopathy, lumbar region: Secondary | ICD-10-CM | POA: Diagnosis present

## 2019-09-17 NOTE — Patient Instructions (Addendum)
Gave supine self R hip IR stretch 1 min x 3 for 3 sessions daily as part of his HEP. Pt demonstrated and verbalized understanding.    Pt was recommended to perform R trunk side bending to help decrease R low back pain. Pt demonstrated and verbalized understanding.

## 2019-09-17 NOTE — Therapy (Signed)
[facility] [address] [place], [address] Phone: [phone]   Fax:  [phone]  Physical Therapy Evaluation  Patient Details  Name: Douglas Phelps MRN: [id number] Date of Birth: [birth year] Referring Provider (PT): Jennings Books, MD   Encounter Date: 09/17/2019  PT End of Session - 09/17/19 1602    Visit Number  1    Number of Visits  17    Date for PT Re-Evaluation  11/14/19    Authorization Type  1    Authorization Time Period  of 10 progress report    PT Start Time  1602    PT Stop Time  1703    PT Time Calculation (min)  61 min    Activity Tolerance  Patient tolerated treatment well    Behavior During Therapy  [facility] for tasks assessed/performed       Past Medical History:  Diagnosis Date  . GERD (gastroesophageal reflux disease)   . High cholesterol   . Hx of CABG   . Hypertension   . Renal disorder   . SI (sacroiliac) joint dysfunction   . Thyroid disease     Past Surgical History:  Procedure Laterality Date  . APPENDECTOMY    . CARDIAC SURGERY     CABG x 3  06/2000  . COLON SURGERY    . PROSTATE SURGERY      There were no vitals filed for this visit.   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  Low back pain: started January 2020 which has progressively worsened. Partly due to medication (atorvastatin) and Vascepa and stopped taking them. The medications gave him myopathy. Had MRI, CT scans for his back and doctors do not know what is wrong with his back. Also feels nerve pain from low back to B LE all the way to his feet. R LE pain starts from R low back to R groin area (around T12/L1 dermatome), then turns to the R mid thigh and travels down R distal lateral thigh and knee then  turns and travels down to medial R leg to ankle (around L4/L5 dermatome). Worst pain is R low back area. L LE: starts from L low back to L LE down to L medial plantar foot along the L3/L4 dermatomal pathway.  Pt also stats having R hernia surgery 6 years ago and Dr. Doy Hutching thinks the groin pain in from his back.  Pt states having more weakness R LE compared to L.  Exercises at [facility] at the hospital 3x/week.  Was able to ambulate 4 miles per hour for about 30 - 50 minutes prior to injury.  However, pt currently able to ambulate 2.7 mph for about 12 minutes. Was able to go on the elliptical 25-30 minutes a year ago. Pt currently unable to use the elliptical for 10 minutes. Denies bowel or bladder problems. Felt R medial proximal thigh numbness and tingling December 2020 and had an MRI afterwards. Dr. Doy Hutching had pt undergo a CT scan in January 2021 which was negative.  Had PT last year in 2020 which made his pain worse. Stopped after 6 or 7 session. Pt had heat for his L low back, supine R and L SKTC, supine  hip abduction which did not help.    Patient Stated Goals  Decrease pain, return to prior exercise level. Be able to garden.    Currently in Pain?  Yes    Pain Score  5     Pain Location  Back    Pain Orientation  Right;Left    Pain Descriptors / Indicators  Pins and needles;Numbness;Aching;Stabbing;Sharp    Pain Type  Chronic pain    Pain Onset  More than a month ago    Pain Frequency  Occasional    Aggravating Factors   R low back: crossing his R leg over his L knee, walking on the treadmill, walking in general, sitting for an hour. Sleeping in any position (pt sleeps on his L side with pillow between knees). Sleeping on L side feels better thatn sleeping on R side.    Pain Relieving Factors  Salon pas (which brings pain down by 2-3 levels). Pain feels better in the morning. Leaning forward.         [facility] PT Assessment - 09/17/19 1634      Assessment   Medical Diagnosis  Low back  pain    Referring Provider (PT)  Jennings Books, MD    Onset Date/Surgical Date  09/02/19    Prior Therapy  Had prior PT for low back pain which aggravated symptoms per pt.       Precautions   Precaution Comments  hx of CABG      Restrictions   Other Position/Activity Restrictions  No known restrictions      Balance Screen   Has the patient fallen in the past 6 months  No    Has the patient had a decrease in activity level because of a fear of falling?   No    Is the patient reluctant to leave their home because of a fear of falling?   No      Prior Function   Vocation  Retired    Biomedical scientist  Pt able to [employer] longer distances with minimal to no back or LE pain.       Observation/Other Assessments   Focus on Therapeutic Outcomes (FOTO)   Lumbar FOTO: 40      Posture/Postural Control   Posture Comments  protracted neck, kyphosis, R shoulder lower, slight R lateral shift, movement crease at T12/L1. R low back side bend up to T12/L1; R hip in ER, B foot pronation       AROM   Lumbar Flexion  limited with R low back pain    Lumbar Extension  limited with slight R low back pain (R trunk rotation observed)     Lumbar - Right Side [facility]. Decreased pain    Lumbar - Left Side [facility] with increased R low back pain    Lumbar - Right Rotation  WFL with increased R low back pain    Lumbar - Left Rotation  WFL with slight R low back pain.       PROM   Overall PROM Comments  hip at 90/90: IR: R 24 degrees with R low back pain and R medial thigh pulling (same area as [place]), L 32 degrees      Strength   Right Hip Flexion  4-/5    Right Hip Extension  3/5   seated manually resisted   Left Hip Flexion  4/5    Left Hip Extension  3+/5   seated manually resisted   Right Knee Flexion  5/5    Right Knee Extension  5/5    Left Knee Flexion  4+/5    Left Knee Extension  5/5                Objective measurements completed on examination: See above findings.    Blood pressure is controlled per pt.     Check hip IR at 90/90 position Stability   No back, hip, knee surgeries.   P to A to R L4, L5 causes R posterior hip symptoms along sciatic nerve pathway not past hip.   R lateral shift correction: centralization of R posterior hip symptoms.   Lumbar extension without L low back rotatoin: feels better. Eased pain off     Hx of CABG   Therapeutic exercise Supine R hip IR stretch 1 min x 3  Decreased R posteripor thigh pain, centralized R low back pain  Improved exercise technique, movement at target joints, use of target muscles after mod verbal, visual, tactile cues.         Response to treatment Decreased pain low back and R posterior hip pain with centralization of symptoms.     Clinical impression  R side bend and neutral extension movement preference for low back   Patient is a 79 year old patient who came to physical therapy secondary to R > L low back pain with R > L LE radiating symptoms to his ankles and feet. He also presents with reproduction of symptoms with lumbar AROM; R side band and neutral extension movement preference, bilateral hip weakness, TTP to low back, and difficulty performing tasks such as crossing his R leg over his L, walking, as well as tolerating positions such as sitting, supine, and S/L. Pt will benefit from skilled physical therapy services to address the aforementioned deficits.           PT Education - 09/17/19 1806    Education Details  ther-ex, HEP, plan of care    Person(s) Educated  Patient    Methods  Explanation;Demonstration;Tactile cues;Verbal cues    Comprehension  Returned demonstration;Verbalized understanding       PT Short Term Goals - 09/17/19 1819      PT SHORT TERM GOAL #1   Title  Patient will be independent with his HEP to decrease pain, improve strength and function.    Baseline  Pt has started his HEP (09/17/2019)    Time  3    Period  Weeks    Status   New    Target Date  10/10/19        PT Long Term Goals - 09/17/19 1820      PT LONG TERM GOAL #1   Title  Patient will have a decrease in low back pain to 2/10 or less at most to promote ability to ambulate longer distances, as well as tolerate positions such as prolonged sitting.    Baseline  8/10 at most for the past 3 months (09/17/2019)    Time  8    Period  Weeks    Status  New    Target Date  11/14/19      PT LONG TERM GOAL #2   Title  Patient will report centralization of symptoms to his low back/ posterior hips to promote ability to ambulate longer distances.    Baseline  Pain radiates to B feet (09/17/2019)    Time  8    Period  Weeks    Status  New  Target Date  11/14/19      PT LONG TERM GOAL #3   Title  Patient will improve bilateral hip extension strength by at least 1/2 MMT grade to promote ability to ambulate with less difficulty and pain.    Baseline  R 3/5, L 3+/5 (09/17/2019)    Time  8    Period  Weeks    Status  New    Target Date  11/14/19      PT LONG TERM GOAL #4   Title  Pt will improve his lumbar FOTO score by at least 10 points as a demonstration of improved function.    Baseline  Lumbar FOTO: 40 (09/17/2019)    Time  8    Period  Weeks    Status  New    Target Date  11/14/19             Plan - 09/17/19 1813    Clinical Impression Statement  Patient is a 79 year old patient who came to physical therapy secondary to R > L low back pain with R > L LE radiating symptoms to his ankles and feet. He also presents with reproduction of symptoms with lumbar AROM; R side band and neutral extension movement preference, bilateral hip weakness, TTP to low back, and difficulty performing tasks such as crossing his R leg over his L, walking, as well as tolerating positions such as sitting, supine, and S/L. Pt will benefit from skilled physical therapy services to address the aforementioned deficits.    Personal Factors and Comorbidities  Age;Comorbidity  3+;Past/Current Experience;Time since onset of injury/illness/exacerbation    Comorbidities  hx of CABG, HTN, renal disorder, high cholesterol, statin medication    Examination-Activity Limitations  Dressing;Stand;Bend;Sit;Lift;Sleep;Locomotion Level    Stability/Clinical Decision Making  Evolving/Moderate complexity   Pain is worsening per pt   Clinical Decision Making  Moderate    Rehab Potential  Fair    PT Frequency  2x / week    PT Duration  8 weeks    PT Treatment/Interventions  Therapeutic exercise;Neuromuscular re-education;Therapeutic activities;Functional mobility training;Patient/family education;Manual techniques;Dry needling;Aquatic Therapy    PT Next Visit Plan  hip, thoracic, lumbar mobility; trunk, glute strengthening, manual techniques, modalities PRN    Consulted and Agree with Plan of Care  Patient       Patient will benefit from skilled therapeutic intervention in order to improve the following deficits and impairments:  Improper body mechanics, Pain, Postural dysfunction, Difficulty walking, Hypomobility, Decreased strength, Decreased range of motion  Visit Diagnosis: Chronic bilateral low back pain, unspecified whether sciatica present - Plan: PT plan of care cert/re-cert  Radiculopathy, lumbar region - Plan: PT plan of care cert/re-cert  Muscle weakness (generalized) - Plan: PT plan of care cert/re-cert  Difficulty in walking, not elsewhere classified - Plan: PT plan of care cert/re-cert     Problem List Patient Active Problem List   Diagnosis Date Noted  . DDD (degenerative disc disease), lumbosacral 08/27/2018  . Bilateral carotid artery stenosis 02/20/2017  . High cholesterol 01/20/2017  . Hypertension 01/20/2017  . Hx of CABG 01/20/2017  . Carotid stenosis 01/20/2017  . Gastroesophageal reflux disease with hiatal hernia 05/03/2016  . Abnormal barium swallow 05/03/2016  . CKD (chronic kidney disease) stage 3, GFR 30-59 ml/min 07/30/2015  .  Hyperglycemia 03/28/2014  . Moderate mitral insufficiency 03/21/2014  . Malignant neoplasm of prostate ([place]) 10/11/2013  . ASCVD (arteriosclerotic cardiovascular disease) 10/11/2013  . Hypothyroidism 10/11/2013    Joneen Boers PT, DPT  09/17/2019, 6:42 PM  [place] [address], [place], [address] Phone: [phone]   Fax:  [phone]  Name: Douglas Phelps MRN: [id number] Date of Birth: [birth year]

## 2019-09-19 ENCOUNTER — Ambulatory Visit: Payer: Medicare PPO

## 2019-09-24 ENCOUNTER — Ambulatory Visit: Payer: Medicare PPO

## 2019-09-24 ENCOUNTER — Other Ambulatory Visit: Payer: Self-pay

## 2019-09-24 DIAGNOSIS — M6281 Muscle weakness (generalized): Secondary | ICD-10-CM

## 2019-09-24 DIAGNOSIS — M5416 Radiculopathy, lumbar region: Secondary | ICD-10-CM

## 2019-09-24 DIAGNOSIS — R262 Difficulty in walking, not elsewhere classified: Secondary | ICD-10-CM

## 2019-09-24 DIAGNOSIS — G8929 Other chronic pain: Secondary | ICD-10-CM

## 2019-09-24 DIAGNOSIS — M545 Low back pain: Secondary | ICD-10-CM | POA: Diagnosis not present

## 2019-09-24 NOTE — Therapy (Signed)
[place] [facility] [address], [place], [address] Phone: [phone]   Fax:  [phone]  Physical Therapy Treatment  Patient Details  Name: Douglas Phelps MRN: [id number] Date of Birth: July 19, 1940 Referring Provider (PT): Jennings Books, MD   Encounter Date: 09/24/2019  PT End of Session - 09/24/19 1025    Visit Number  2    Number of Visits  17    Date for PT Re-Evaluation  11/14/19    Authorization Type  2    Authorization Time Period  of 10 progress report    PT Start Time  1026    PT Stop Time  1118    PT Time Calculation (min)  52 min    Activity Tolerance  Patient tolerated treatment well    Behavior During Therapy  [facility] for tasks assessed/performed       Past Medical History:  Diagnosis Date  . GERD (gastroesophageal reflux disease)   . High cholesterol   . Hx of CABG   . Hypertension   . Renal disorder   . SI (sacroiliac) joint dysfunction   . Thyroid disease     Past Surgical History:  Procedure Laterality Date  . APPENDECTOMY    . CARDIAC SURGERY     CABG x 3  06/2000  . COLON SURGERY    . PROSTATE SURGERY      There were no vitals filed for this visit.  Subjective Assessment - 09/24/19 1027    Subjective  Today, pt has 3/10 R low back. Not radiating today like last week. L side is the same as last week. Not having a lot of discomfort. 1-2/10 L low back. 3/10 R low back currently. The last session helped him last session.    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  Low back pain: started January 2020 which has progressively worsened. Partly due to medication (atorvastatin) and Vascepa and stopped taking them. The medications gave him myopathy. Had MRI, CT scans for his back and doctors do not know what is wrong with his back. Also feels nerve pain from low back to B LE all the way to his feet. R LE pain starts  from R low back to R groin area (around T12/L1 dermatome), then turns to the R mid thigh and travels down R distal lateral thigh and knee then turns and travels down to medial R leg to ankle (around L4/L5 dermatome). Worst pain is R low back area. L LE: starts from L low back to L LE down to L medial plantar foot along the L3/L4 dermatomal pathway.  Pt also stats having R hernia surgery 6 years ago and Dr. Doy Hutching thinks the groin pain in from his back.  Pt states having more weakness R LE compared to L.  Exercises at the Wellness zone at the hospital 3x/week.  Was able to ambulate 4 miles per hour for about 30 - 50 minutes prior to injury.  However, pt currently able to ambulate 2.7 mph for about 12 minutes. Was able to go on the elliptical 25-30 minutes a year ago. Pt currently unable to use the elliptical for 10 minutes. Denies bowel or bladder problems. Felt R medial proximal thigh numbness and tingling December 2020 and had an MRI afterwards. Dr. Doy Hutching had pt undergo a CT scan in January 2021 which was negative.  Had PT last year  in 2020 which made his pain worse. Stopped after 6 or 7 session. Pt had heat for his L low back, supine R and L SKTC, supine hip abduction which did not help.    Patient Stated Goals  Decrease pain, return to prior exercise level. Be able to garden.    Currently in Pain?  Yes    Pain Score  3     Pain Onset  More than a month ago                               PT Education - 09/24/19 1047    Education Details  ther-ex, HEP    Person(s) Educated  Patient    Methods  Explanation;Demonstration;Tactile cues;Verbal cues;Handout    Comprehension  Returned demonstration;Verbalized understanding       Objective    Blood pressure is controlled per pt.     Check hip IR at 90/90 position Stability   No back, hip, knee surgeries.   P to A to R L4, L5 causes R posterior hip symptoms along sciatic nerve pathway not past hip.   R lateral  shift correction: centralization of R posterior hip symptoms.   Lumbar extension without L low back rotation: feels better. Eased pain off     Hx of CABG   medbridge Access Code [email or web address]  Therapeutic exercise  Standing R lateral shift correction 10x2 with 5 second holds. No change  Standing back extension to the L 10x3. R low back discomfort  Standing R side bend 10x3   Sitting up straight for low back extension position. Feels good. 10x5 seconds   Sitting with lumbar towel roll 1 min x 2  Seated R hip extension isometrics 5x5 seconds. Increased pain  Then L 5x5 seconds, no change  Supine R hip IR stretch with PT  3x. Decreased R hip pain to "almost gone."  Supine with hip in 90/90  R hip IR 10x3   No pain in supine after hip IR ROM treatment for R side  Supine R hip long axis distraction: slight R posterior hip/sciatic discomfort. Eases with rest  Supine transversus abdominis contraction 10x5 seconds for 2 sets  Sitting up straight/upright posture. Decreased back pain in R    Improved exercise technique, movement at target joints, use of target muscles after mod verbal, visual, tactile cues.         Response to treatment 1/10 back pain after session. Pt states that he is walking out of the session with less pain.     Clinical impression Improved low back pain with treatment to promote R hip IR as well as gentle lumbar extension. Decreased low back pain to 1/10 after session. Pt will benefit from continued skilled physical therapy services to decrease pain, improve ROM, strength and function.     PT Short Term Goals - 09/17/19 1819      PT SHORT TERM GOAL #1   Title  Patient will be independent with his HEP to decrease pain, improve strength and function.    Baseline  Pt has started his HEP (09/17/2019)    Time  3    Period  Weeks    Status  New    Target Date  10/10/19        PT Long Term Goals - 09/17/19 1820      PT LONG  TERM GOAL #1   Title  Patient will have a decrease in low  back pain to 2/10 or less at most to promote ability to ambulate longer distances, as well as tolerate positions such as prolonged sitting.    Baseline  8/10 at most for the past 3 months (09/17/2019)    Time  8    Period  Weeks    Status  New    Target Date  11/14/19      PT LONG TERM GOAL #2   Title  Patient will report centralization of symptoms to his low back/ posterior hips to promote ability to ambulate longer distances.    Baseline  Pain radiates to B feet (09/17/2019)    Time  8    Period  Weeks    Status  New    Target Date  11/14/19      PT LONG TERM GOAL #3   Title  Patient will improve bilateral hip extension strength by at least 1/2 MMT grade to promote ability to ambulate with less difficulty and pain.    Baseline  R 3/5, L 3+/5 (09/17/2019)    Time  8    Period  Weeks    Status  New    Target Date  11/14/19      PT LONG TERM GOAL #4   Title  Pt will improve his lumbar FOTO score by at least 10 points as a demonstration of improved function.    Baseline  Lumbar FOTO: 40 (09/17/2019)    Time  8    Period  Weeks    Status  New    Target Date  11/14/19            Plan - 09/24/19 1025    Clinical Impression Statement  Improved low back pain with treatment to promote R hip IR as well as gentle lumbar extension. Decreased low back pain to 1/10 after session. Pt will benefit from continued skilled physical therapy services to decrease pain, improve ROM, strength and function.    Personal Factors and Comorbidities  Age;Comorbidity 3+;Past/Current Experience;Time since onset of injury/illness/exacerbation    Comorbidities  hx of CABG, HTN, renal disorder, high cholesterol, statin medication    Examination-Activity Limitations  Dressing;Stand;Bend;Sit;Lift;Sleep;Locomotion Level    Stability/Clinical Decision Making  Evolving/Moderate complexity   Pain is worsening per pt   Rehab Potential  Fair    PT Frequency   2x / week    PT Duration  8 weeks    PT Treatment/Interventions  Therapeutic exercise;Neuromuscular re-education;Therapeutic activities;Functional mobility training;Patient/family education;Manual techniques;Dry needling;Aquatic Therapy    PT Next Visit Plan  hip, thoracic, lumbar mobility; trunk, glute strengthening, manual techniques, modalities PRN    Consulted and Agree with Plan of Care  Patient       Patient will benefit from skilled therapeutic intervention in order to improve the following deficits and impairments:  Improper body mechanics, Pain, Postural dysfunction, Difficulty walking, Hypomobility, Decreased strength, Decreased range of motion  Visit Diagnosis: Chronic bilateral low back pain, unspecified whether sciatica present  Radiculopathy, lumbar region  Muscle weakness (generalized)  Difficulty in walking, not elsewhere classified     Problem List Patient Active Problem List   Diagnosis Date Noted  . DDD (degenerative disc disease), lumbosacral 08/27/2018  . Bilateral carotid artery stenosis 02/20/2017  . High cholesterol 01/20/2017  . Hypertension 01/20/2017  . Hx of CABG 01/20/2017  . Carotid stenosis 01/20/2017  . Gastroesophageal reflux disease with hiatal hernia 05/03/2016  . Abnormal barium swallow 05/03/2016  . CKD (chronic kidney disease) stage 3, GFR 30-59 ml/min  07/30/2015  . Hyperglycemia 03/28/2014  . Moderate mitral insufficiency 03/21/2014  . Malignant neoplasm of prostate ([place]) 10/11/2013  . ASCVD (arteriosclerotic cardiovascular disease) 10/11/2013  . Hypothyroidism 10/11/2013    Joneen Boers PT, DPT   09/24/2019, 12:47 PM  [place] [address] [place], [address] Phone: [phone]   Fax:  [phone]  Name: DERELL BENT MRN: [id number] Date of Birth: [birth year]

## 2019-09-24 NOTE — Patient Instructions (Signed)
Access Code: [email or web address] URL: [email or web address] Date: 09/24/2019 Prepared by: Joneen Boers  Exercises Supine Transversus Abdominis Bracing - Hands on Stomach - 1 x daily - 7 x weekly - 3 sets - 10 reps

## 2019-09-26 ENCOUNTER — Ambulatory Visit: Payer: Medicare PPO

## 2019-09-26 ENCOUNTER — Other Ambulatory Visit: Payer: Self-pay

## 2019-09-26 DIAGNOSIS — G8929 Other chronic pain: Secondary | ICD-10-CM

## 2019-09-26 DIAGNOSIS — R262 Difficulty in walking, not elsewhere classified: Secondary | ICD-10-CM

## 2019-09-26 DIAGNOSIS — M545 Low back pain: Secondary | ICD-10-CM | POA: Diagnosis not present

## 2019-09-26 DIAGNOSIS — M5416 Radiculopathy, lumbar region: Secondary | ICD-10-CM

## 2019-09-26 DIAGNOSIS — M6281 Muscle weakness (generalized): Secondary | ICD-10-CM

## 2019-09-26 NOTE — Therapy (Signed)
[facility] [address] [place], [address] Phone: [phone]   Fax:  [phone]  Physical Therapy Treatment  Patient Details  Name: Douglas Phelps MRN: [id number] Date of Birth: 03-04-1941 Referring Provider (PT): Jennings Books, MD   Encounter Date: 09/26/2019  PT End of Session - 09/26/19 1538    Visit Number  3    Number of Visits  17    Date for PT Re-Evaluation  11/14/19    Authorization Type  3    Authorization Time Period  of 10 progress report    PT Start Time  [id number]    PT Stop Time  1530    PT Time Calculation (min)  45 min    Activity Tolerance  Patient tolerated treatment well    Behavior During Therapy  [facility] for tasks assessed/performed       Past Medical History:  Diagnosis Date  . GERD (gastroesophageal reflux disease)   . High cholesterol   . Hx of CABG   . Hypertension   . Renal disorder   . SI (sacroiliac) joint dysfunction   . Thyroid disease     Past Surgical History:  Procedure Laterality Date  . APPENDECTOMY    . CARDIAC SURGERY     CABG x 3  06/2000  . COLON SURGERY    . PROSTATE SURGERY      There were no vitals filed for this visit.  Subjective Assessment - 09/26/19 1447    Subjective  Patient reported that after his last PT session he walked out with less pain, but the next day had increased pain. Today, his pain is less again, it is more located to the, R radiates towards the hip, not towards the middle of the back today.    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  Low back pain: started January 2020 which has progressively worsened. Partly due to medication (atorvastatin) and Vascepa and stopped taking them. The medications gave him myopathy. Had MRI, CT scans for his back and doctors do not know what is wrong with his back. Also feels nerve pain from low back to B LE all the way to his  feet. R LE pain starts from R low back to R groin area (around T12/L1 dermatome), then turns to the R mid thigh and travels down R distal lateral thigh and knee then turns and travels down to medial R leg to ankle (around L4/L5 dermatome). Worst pain is R low back area. L LE: starts from L low back to L LE down to L medial plantar foot along the L3/L4 dermatomal pathway.  Pt also stats having R hernia surgery 6 years ago and Dr. Doy Hutching thinks the groin pain in from his back.  Pt states having more weakness R LE compared to L.  [facility] 3x/week.  Was able to ambulate 4 miles per hour for about 30 - 50 minutes prior to injury.  However, pt currently able to ambulate 2.7 mph for about 12 minutes. Was able to go on the elliptical 25-30 minutes a year ago. Pt currently unable to use the elliptical for 10 minutes. Denies bowel or bladder problems. Felt R medial proximal thigh numbness and tingling December 2020 and had an MRI afterwards. Dr. Doy Hutching had pt undergo a CT scan in January 2021 which was negative.  Had  PT last year in 2020 which made his pain worse. Stopped after 6 or 7 session. Pt had heat for his L low back, supine R and L SKTC, supine hip abduction which did not help.    Patient Stated Goals  Decrease pain, return to prior exercise level. Be able to garden.    Currently in Pain?  Yes    Pain Score  3     Pain Location  Back    Pain Orientation  Right    Pain Descriptors / Indicators  Aching;Constant    Pain Type  Chronic pain    Pain Onset  More than a month ago    Pain Frequency  Constant       Objective      Blood pressure is controlled per pt.      Check hip IR at 90/90 position Stability   No back, hip, knee surgeries.    P to A to R L4, L5 causes R posterior hip symptoms along sciatic nerve pathway not past hip.    R lateral shift correction: centralization of R posterior hip symptoms.    Lumbar extension without L low back rotation:  feels better. Eased pain off     Hx of CABG   medbridge Access Code [email or web address]    Therapeutic exercise   Standing R lateral shift correction 10x2, pt ambulated after and reported pain travelled down posterior thigh to just above the knee  Standing L lateral shift correction 10x2, pt ambulated, no change ( R symptoms returned to baseline)  Seated extension x10 no change   Standing back extension 3x10, pt reported centralization and decrease in pain  Prone lying, ~2 mins., reported pain decreased in hip, and felt it more in his back   Prone propping x42mins with continued reported centralization of pain. After ambulating, pt reported pain returned to R hip area, more lateral to initial pain at start of session. In sitting pt still reported decrease in pain but still 3/10.     Sitting with lumbar towel roll x2 mins( at start of session)  Supine R hip IR stretch with PT x3 with foot flat on table, and then x3 in the 90/90 position, x30sec ea stretch. Reported feeling as if the stretch is right where his pain is located.   Supine with hip in 90/90             R hip IR/ER PROM 10x3   Supine R hip long axis distraction, no pain    Pt response/clinical impression: Pt with variable pain response over session. Pain seemed to centralize with extension based exercises, but returned to R hip area with ambulation. Peripheralized  today with R lateral shift correction. Pt also reported improvement in pain with R hip intervention. The patient at end of session reported initially 0/10 pain, but with two laps of ambulation around the gym reported 1/10 pain. Return of 1/10 pain localized around R PSIS. The patient would benefit from further skilled PT intervention to continue to assess directional preference and hip involvement to progress pt towards goals.       PT Education - 09/26/19 1449    Education Details  therex, HEP    Person(s) Educated  Patient    Methods   Explanation;Demonstration;Tactile cues;Verbal cues    Comprehension  Verbalized understanding;Returned demonstration;Need further instruction;Verbal cues required       PT Short Term Goals - 09/17/19 1819      PT SHORT TERM GOAL #1  Title  Patient will be independent with his HEP to decrease pain, improve strength and function.    Baseline  Pt has started his HEP (09/17/2019)    Time  3    Period  Weeks    Status  New    Target Date  10/10/19        PT Long Term Goals - 09/17/19 1820      PT LONG TERM GOAL #1   Title  Patient will have a decrease in low back pain to 2/10 or less at most to promote ability to ambulate longer distances, as well as tolerate positions such as prolonged sitting.    Baseline  8/10 at most for the past 3 months (09/17/2019)    Time  8    Period  Weeks    Status  New    Target Date  11/14/19      PT LONG TERM GOAL #2   Title  Patient will report centralization of symptoms to his low back/ posterior hips to promote ability to ambulate longer distances.    Baseline  Pain radiates to B feet (09/17/2019)    Time  8    Period  Weeks    Status  New    Target Date  11/14/19      PT LONG TERM GOAL #3   Title  Patient will improve bilateral hip extension strength by at least 1/2 MMT grade to promote ability to ambulate with less difficulty and pain.    Baseline  R 3/5, L 3+/5 (09/17/2019)    Time  8    Period  Weeks    Status  New    Target Date  11/14/19      PT LONG TERM GOAL #4   Title  Pt will improve his lumbar FOTO score by at least 10 points as a demonstration of improved function.    Baseline  Lumbar FOTO: 40 (09/17/2019)    Time  8    Period  Weeks    Status  New    Target Date  11/14/19            Plan - 09/26/19 1538    Clinical Impression Statement  Pt with variable pain response over session. Pain seemed to centralize with extension based exercises, but returned to R hip area with ambulation. Peripheralized  today with R lateral  shift correction. Pt also reported improvement in pain with R hip intervention. The patient at end of session reported initially 0/10 pain, but with two laps of ambulation around the gym reported 1/10 pain. Return of 1/10 pain localized around R PSIS. The patient would benefit from further skilled PT intervention to continue to assess directional preference and hip involvement to progress pt towards goals.    Personal Factors and Comorbidities  Age;Comorbidity 3+;Past/Current Experience;Time since onset of injury/illness/exacerbation    Comorbidities  hx of CABG, HTN, renal disorder, high cholesterol, statin medication    Examination-Activity Limitations  Dressing;Stand;Bend;Sit;Lift;Sleep;Locomotion Level    Stability/Clinical Decision Making  Evolving/Moderate complexity   pain is worsening per pt   Rehab Potential  Fair    PT Frequency  2x / week    PT Duration  8 weeks    PT Treatment/Interventions  Therapeutic exercise;Neuromuscular re-education;Therapeutic activities;Functional mobility training;Patient/family education;Manual techniques;Dry needling;Aquatic Therapy    PT Next Visit Plan  hip, thoracic, lumbar mobility; trunk, glute strengthening, manual techniques, modalities PRN    Consulted and Agree with Plan of Care  Patient  Patient will benefit from skilled therapeutic intervention in order to improve the following deficits and impairments:  Improper body mechanics, Pain, Postural dysfunction, Difficulty walking, Hypomobility, Decreased strength, Decreased range of motion  Visit Diagnosis: Chronic bilateral low back pain, unspecified whether sciatica present  Radiculopathy, lumbar region  Muscle weakness (generalized)  Difficulty in walking, not elsewhere classified     Problem List Patient Active Problem List   Diagnosis Date Noted  . DDD (degenerative disc disease), lumbosacral 08/27/2018  . Bilateral carotid artery stenosis 02/20/2017  . High cholesterol  01/20/2017  . Hypertension 01/20/2017  . Hx of CABG 01/20/2017  . Carotid stenosis 01/20/2017  . Gastroesophageal reflux disease with hiatal hernia 05/03/2016  . Abnormal barium swallow 05/03/2016  . CKD (chronic kidney disease) stage 3, GFR 30-59 ml/min 07/30/2015  . Hyperglycemia 03/28/2014  . Moderate mitral insufficiency 03/21/2014  . Malignant neoplasm of prostate ([place]) 10/11/2013  . ASCVD (arteriosclerotic cardiovascular disease) 10/11/2013  . Hypothyroidism 10/11/2013    Lieutenant Diego PT, DPT 3:45 PM,09/26/19   [facility] [address], [place], [address] Phone: [phone]   Fax:  [phone]  Name: Douglas Phelps MRN: [id number] Date of Birth: May 18, 1941

## 2019-10-01 ENCOUNTER — Other Ambulatory Visit: Payer: Self-pay

## 2019-10-01 ENCOUNTER — Ambulatory Visit: Payer: Medicare PPO | Attending: Neurology

## 2019-10-01 DIAGNOSIS — M6281 Muscle weakness (generalized): Secondary | ICD-10-CM | POA: Insufficient documentation

## 2019-10-01 DIAGNOSIS — M5416 Radiculopathy, lumbar region: Secondary | ICD-10-CM | POA: Insufficient documentation

## 2019-10-01 DIAGNOSIS — R262 Difficulty in walking, not elsewhere classified: Secondary | ICD-10-CM | POA: Diagnosis present

## 2019-10-01 DIAGNOSIS — M545 Low back pain: Secondary | ICD-10-CM | POA: Insufficient documentation

## 2019-10-01 DIAGNOSIS — G8929 Other chronic pain: Secondary | ICD-10-CM

## 2019-10-01 NOTE — Therapy (Signed)
[facility] [address] [place], [address] Phone: [phone]   Fax:  [phone]  Physical Therapy Treatment  Patient Details  Name: Douglas Phelps MRN: [id number] Date of Birth: 21-May-1941 Referring Provider (PT): Jennings Books, MD   Encounter Date: 10/01/2019  PT End of Session - 10/01/19 1342    Visit Number  4    Number of Visits  17    Date for PT Re-Evaluation  11/14/19    Authorization Type  4    Authorization Time Period  of 10 progress report    PT Start Time  [id number]    PT Stop Time  1428    PT Time Calculation (min)  46 min    Activity Tolerance  Patient tolerated treatment well    Behavior During Therapy  [facility] for tasks assessed/performed       Past Medical History:  Diagnosis Date  . GERD (gastroesophageal reflux disease)   . High cholesterol   . Hx of CABG   . Hypertension   . Renal disorder   . SI (sacroiliac) joint dysfunction   . Thyroid disease     Past Surgical History:  Procedure Laterality Date  . APPENDECTOMY    . CARDIAC SURGERY     CABG x 3  06/2000  . COLON SURGERY    . PROSTATE SURGERY      There were no vitals filed for this visit.  Subjective Assessment - 10/01/19 1344    Subjective  4/10 R posterior hip and low back currently. Stopped taking his pain medicine (tramadol 25/25/50 and Tylenol 1000mg ) this weekend and his pain was significant. Went back on his pain medication.  Went out with less pain after last session which lasted 10-12 hours.    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  Low back pain: started January 2020 which has progressively worsened. Partly due to medication (atorvastatin) and Vascepa and stopped taking them. The medications gave him myopathy. Had MRI, CT scans for his back and doctors do not know what is wrong with his back. Also feels nerve pain from low back to B LE  all the way to his feet. R LE pain starts from R low back to R groin area (around T12/L1 dermatome), then turns to the R mid thigh and travels down R distal lateral thigh and knee then turns and travels down to medial R leg to ankle (around L4/L5 dermatome). Worst pain is R low back area. L LE: starts from L low back to L LE down to L medial plantar foot along the L3/L4 dermatomal pathway.  Pt also stats having R hernia surgery 6 years ago and Dr. Doy Hutching thinks the groin pain in from his back.  Pt states having more weakness R LE compared to L.  Exercises at the [facility] at the hospital 3x/week.  Was able to ambulate 4 miles per hour for about 30 - 50 minutes prior to injury.  However, pt currently able to ambulate 2.7 mph for about 12 minutes. Was able to go on the elliptical 25-30 minutes a year ago. Pt currently unable to use the elliptical for 10 minutes. Denies bowel or bladder problems. Felt R medial proximal thigh numbness and tingling December 2020 and had an MRI afterwards. Dr. Doy Hutching had pt undergo a CT scan in January 2021 which was negative.  Had PT last  year in 2020 which made his pain worse. Stopped after 6 or 7 session. Pt had heat for his L low back, supine R and L SKTC, supine hip abduction which did not help.    Patient Stated Goals  Decrease pain, return to prior exercise level. Be able to garden.    Currently in Pain?  Yes    Pain Score  5    4-5/10   Pain Onset  More than a month ago                               PT Education - 10/01/19 2014    Education Details  ther-ex    [employer]) Educated  Patient    Methods  Explanation;Demonstration;Tactile cues;Verbal cues    Comprehension  Returned demonstration;Verbalized understanding      Objective    Blood pressure is controlled per pt.     Stability  No back, hip, knee surgeries.   P to A to R L4, L5 causes R posterior hip symptoms along sciatic nerve pathway not past hip.   R lateral  shift correction: centralization of R posterior hip symptoms.   Lumbar extension without L low back rotation: feels better. Eased pain off   Hx of CABG  medbridgeAccess Code [email or web address]   Manual therapy  Seated STM R L4 area which feels like a "tootsie" roll. R groin symptoms which eases with gentle lumbar extension     Therapeutic exercise  Standing back extension 10x2 with 5 seconds  Seated back extension with slight R trunk rotation 5x10 seconds for 2 sets. Centralized R groin pain to R low back. No R posterior hip pain aferwards, centralized to around R L4/L5 area.   Seated hip extension isometrics   L 10x5 seconds   R 10x5 seconds  Seated hip flexion isometrics, PT manual resistance   R 5x5 seconds   L  5x5 seconds  Seated trunk rotation   R 3x5 seconds. Slight increase in R low back symptoms  L 5x5 seconds. Slight increase in R low back symptoms   Standing chops double red band  To the R 10x5 seconds   To the L 10x5 seconds   Standing manual R lateral shift correction PT L to R pressure to around L1 area 4x5 seconds. Decreased pain to 2-3/10  Seated manually resisted L lateral shift isometrics in neutral to counter R lateral shift 10x2 with 5 second holds. Decreased R low back pain to 2-3/10 to R L4/5 area. No R posterior hip pain after session.    Improved exercise technique, movement at target joints, use of target muscles after mod verbal, visual, tactile cues.     Pt response/clinical impression:  Centralized R groin and posterior hip symptoms to R low back L4/L5 area with gentle lumbar extension suggesting possible disc involvement in symptoms.  Decreased pain with trunk strengthening to decrease R lateral shift posture. Decreased R low back pain to 2-3/10 to R L4/5 area, no R posterior hip pain after session. Pt will benefit from continued skilled physical therapy services to decrease pain, improve strength and function.      PT Short Term Goals -  09/17/19 1819      PT SHORT TERM GOAL #1   Title  Patient will be independent with his HEP to decrease pain, improve strength and function.    Baseline  Pt has started his HEP (09/17/2019)    Time  3  Period  Weeks    Status  New    Target Date  10/10/19        PT Long Term Goals - 09/17/19 1820      PT LONG TERM GOAL #1   Title  Patient will have a decrease in low back pain to 2/10 or less at most to promote ability to ambulate longer distances, as well as tolerate positions such as prolonged sitting.    Baseline  8/10 at most for the past 3 months (09/17/2019)    Time  8    Period  Weeks    Status  New    Target Date  11/14/19      PT LONG TERM GOAL #2   Title  Patient will report centralization of symptoms to his low back/ posterior hips to promote ability to ambulate longer distances.    Baseline  Pain radiates to B feet (09/17/2019)    Time  8    Period  Weeks    Status  New    Target Date  11/14/19      PT LONG TERM GOAL #3   Title  Patient will improve bilateral hip extension strength by at least 1/2 MMT grade to promote ability to ambulate with less difficulty and pain.    Baseline  R 3/5, L 3+/5 (09/17/2019)    Time  8    Period  Weeks    Status  New    Target Date  11/14/19      PT LONG TERM GOAL #4   Title  Pt will improve his lumbar FOTO score by at least 10 points as a demonstration of improved function.    Baseline  Lumbar FOTO: 40 (09/17/2019)    Time  8    Period  Weeks    Status  New    Target Date  11/14/19            Plan - 10/01/19 1338    Clinical Impression Statement  Centralized R groin and posterior hip symptoms to R low back L4/L5 area with gentle lumbar extension suggesting possible disc involvement in symptoms.  Decreased pain with trunk strengthening to decrease R lateral shift posture. Decreased R low back pain to 2-3/10 to R L4/5 area, no R posterior hip pain after session. Pt will benefit from continued skilled physical therapy  services to decrease pain, improve strength and function.    Personal Factors and Comorbidities  Age;Comorbidity 3+;Past/Current Experience;Time since onset of injury/illness/exacerbation    Comorbidities  hx of CABG, HTN, renal disorder, high cholesterol, statin medication    Examination-Activity Limitations  Dressing;Stand;Bend;Sit;Lift;Sleep;Locomotion Level    Stability/Clinical Decision Making  Evolving/Moderate complexity   pain is worsening per pt   Rehab Potential  Fair    PT Frequency  2x / week    PT Duration  8 weeks    PT Treatment/Interventions  Therapeutic exercise;Neuromuscular re-education;Therapeutic activities;Functional mobility training;Patient/family education;Manual techniques;Dry needling;Aquatic Therapy    PT Next Visit Plan  hip, thoracic, lumbar mobility; trunk, glute strengthening, manual techniques, modalities PRN    Consulted and Agree with Plan of Care  Patient       Patient will benefit from skilled therapeutic intervention in order to improve the following deficits and impairments:  Improper body mechanics, Pain, Postural dysfunction, Difficulty walking, Hypomobility, Decreased strength, Decreased range of motion  Visit Diagnosis: Chronic bilateral low back pain, unspecified whether sciatica present  Radiculopathy, lumbar region  Muscle weakness (generalized)  Difficulty in walking, not  elsewhere classified     Problem List Patient Active Problem List   Diagnosis Date Noted  . DDD (degenerative disc disease), lumbosacral 08/27/2018  . Bilateral carotid artery stenosis 02/20/2017  . High cholesterol 01/20/2017  . Hypertension 01/20/2017  . Hx of CABG 01/20/2017  . Carotid stenosis 01/20/2017  . Gastroesophageal reflux disease with hiatal hernia 05/03/2016  . Abnormal barium swallow 05/03/2016  . CKD (chronic kidney disease) stage 3, GFR 30-59 ml/min 07/30/2015  . Hyperglycemia 03/28/2014  . Moderate mitral insufficiency 03/21/2014  . Malignant  neoplasm of prostate ([place]) 10/11/2013  . ASCVD (arteriosclerotic cardiovascular disease) 10/11/2013  . Hypothyroidism 10/11/2013     Joneen Boers PT, DPT  10/01/2019, 8:21 PM  [facility] [address], [place], [address] Phone: [phone]   Fax:  [phone]  Name: Douglas Phelps MRN: [id number] Date of Birth: 1940-09-14

## 2019-10-03 ENCOUNTER — Other Ambulatory Visit: Payer: Self-pay

## 2019-10-03 ENCOUNTER — Ambulatory Visit: Payer: Medicare PPO

## 2019-10-03 DIAGNOSIS — R262 Difficulty in walking, not elsewhere classified: Secondary | ICD-10-CM

## 2019-10-03 DIAGNOSIS — M545 Low back pain: Secondary | ICD-10-CM | POA: Diagnosis not present

## 2019-10-03 DIAGNOSIS — M6281 Muscle weakness (generalized): Secondary | ICD-10-CM

## 2019-10-03 DIAGNOSIS — M5416 Radiculopathy, lumbar region: Secondary | ICD-10-CM

## 2019-10-03 DIAGNOSIS — G8929 Other chronic pain: Secondary | ICD-10-CM

## 2019-10-03 NOTE — Therapy (Signed)
[facility] [address], [place], [address] Phone: [phone]   Fax:  [phone]  Physical Therapy Treatment  Patient Details  Name: Douglas Phelps MRN: [id number] Date of Birth: 01-06-1941 Referring Provider (PT): Jennings Books, MD   Encounter Date: 10/03/2019  PT End of Session - 10/03/19 1257    Visit Number  5    Number of Visits  17    Date for PT Re-Evaluation  11/14/19    Authorization Type  5    Authorization Time Period  of 10 progress report    PT Start Time  1300    PT Stop Time  1343    PT Time Calculation (min)  43 min    Activity Tolerance  Patient tolerated treatment well    Behavior During Therapy  [facility] for tasks assessed/performed       Past Medical History:  Diagnosis Date  . GERD (gastroesophageal reflux disease)   . High cholesterol   . Hx of CABG   . Hypertension   . Renal disorder   . SI (sacroiliac) joint dysfunction   . Thyroid disease     Past Surgical History:  Procedure Laterality Date  . APPENDECTOMY    . CARDIAC SURGERY     CABG x 3  06/2000  . COLON SURGERY    . PROSTATE SURGERY      There were no vitals filed for this visit.  Subjective Assessment - 10/03/19 1300    Subjective  Patient reported that he had his back brace on for his yard work so he's not feeling too bad right now. Pt stated his current pain in the back is about 3/10, that his leg pain is about a 4/10. Pt stated he went out feeling about the same, a little bit less pain.    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  Low back pain: started January 2020 which has progressively worsened. Partly due to medication (atorvastatin) and Vascepa and stopped taking them. The medications gave him myopathy. Had MRI, CT scans for his back and doctors do not know what is wrong with his back. Also feels nerve pain from low back to B LE  all the way to his feet. R LE pain starts from R low back to R groin area (around T12/L1 dermatome), then turns to the R mid thigh and travels down R distal lateral thigh and knee then turns and travels down to medial R leg to ankle (around L4/L5 dermatome). Worst pain is R low back area. L LE: starts from L low back to L LE down to L medial plantar foot along the L3/L4 dermatomal pathway.  Pt also stats having R hernia surgery 6 years ago and Dr. Doy Hutching thinks the groin pain in from his back.  Pt states having more weakness R LE compared to L.  [facility] 3x/week.  Was able to ambulate 4 miles per hour for about 30 - 50 minutes prior to injury.  However, pt currently able to ambulate 2.7 mph for about 12 minutes. Was able to go on the elliptical 25-30 minutes a year ago. Pt currently unable to use the elliptical for 10 minutes. Denies bowel or bladder problems. Felt R medial proximal thigh numbness and tingling December 2020 and had an MRI afterwards. Dr. Doy Hutching had pt undergo a CT scan  in January 2021 which was negative.  Had PT last year in 2020 which made his pain worse. Stopped after 6 or 7 session. Pt had heat for his L low back, supine R and L SKTC, supine hip abduction which did not help.    Patient Stated Goals  Decrease pain, return to prior exercise level. Be able to garden.    Currently in Pain?  Yes    Pain Score  4    back and anterior thighs   Pain Location  Back    Pain Orientation  Right    Pain Descriptors / Indicators  Aching;Contraction    Pain Onset  More than a month ago    Pain Frequency  Constant       Objective Blood pressure is controlled per pt.  Stability No back, hip, knee surgeries.  P to A to R L4, L5 causes R posterior hip symptoms along sciatic nerve pathway not past hip.  R lateral shift correction: centralization of R posterior hip symptoms.  Lumbar extension without L low back rotation: feels better. Eased pain off Hx of  CABG   medbridge Access Code [email or web address]     Manual therapy  Seated STM R L4 area which feels like a "tootsie" roll. R groin symptoms which eases with gentle lumbar extension    Therapeutic exercise Supine propping x2 minutes Quad stretch in prone 2x30sec with PT assist Standing back extension 10x2 with 5 seconds   Seated hip extension isometrics              L 10x5 seconds              R 10x5 seconds With mirror feedback to maintain neutral posture   Seated hip flexion isometrics, PT manual resistance              R 10x5 seconds              L  10x5 seconds  Seated piriformis stretch 2x30seconds   Standing chops double red band             To the R 10x5 seconds              To the L 10x5 seconds      Seated manually resisted L lateral shift isometrics in neutral to counter R lateral shift 10x2 with 5 second holds.   Improved exercise technique, movement at target joints, use of target muscles after mod verbal, visual, tactile cues.    Pt response/clinical impression:  The patient reported decreased lumbar pain after STM, as well slightly decreased anterior leg pain. At end of session the patient did complain of more lateral back pain. Pt reported no increased pain during extension based exercises, may benefit from updated HEP to include standing extension and self lateral shift correction to assess response. Pt will benefit from continued skilled physical therapy services to decrease pain, improve strength and function.     PT Education - 10/03/19 1256    Education Details  therex    Person(s) Educated  Patient    Methods  Explanation;Demonstration;Tactile cues;Verbal cues    Comprehension  Verbalized understanding;Returned demonstration       PT Short Term Goals - 09/17/19 1819      PT SHORT TERM GOAL #1   Title  Patient will be independent with his HEP to decrease pain, improve strength and function.    Baseline  Pt has started his HEP (09/17/2019)    Time  3  Period  Weeks    Status  New    Target Date  10/10/19        PT Long Term Goals - 09/17/19 1820      PT LONG TERM GOAL #1   Title  Patient will have a decrease in low back pain to 2/10 or less at most to promote ability to ambulate longer distances, as well as tolerate positions such as prolonged sitting.    Baseline  8/10 at most for the past 3 months (09/17/2019)    Time  8    Period  Weeks    Status  New    Target Date  11/14/19      PT LONG TERM GOAL #2   Title  Patient will report centralization of symptoms to his low back/ posterior hips to promote ability to ambulate longer distances.    Baseline  Pain radiates to B feet (09/17/2019)    Time  8    Period  Weeks    Status  New    Target Date  11/14/19      PT LONG TERM GOAL #3   Title  Patient will improve bilateral hip extension strength by at least 1/2 MMT grade to promote ability to ambulate with less difficulty and pain.    Baseline  R 3/5, L 3+/5 (09/17/2019)    Time  8    Period  Weeks    Status  New    Target Date  11/14/19      PT LONG TERM GOAL #4   Title  Pt will improve his lumbar FOTO score by at least 10 points as a demonstration of improved function.    Baseline  Lumbar FOTO: 40 (09/17/2019)    Time  8    Period  Weeks    Status  New    Target Date  11/14/19            Plan - 10/03/19 1257    Clinical Impression Statement  The patient reported decreased lumbar pain after STM, as well slightly decreased anterior leg pain. At end of session the patient did complain of more lateral back pain. Pt reported no increased pain during extension based exercises, may benefit from updated HEP to include standing extension and self lateral shift correction to assess response. Pt will benefit from continued skilled physical therapy services to decrease pain, improve strength and function.    Personal Factors and Comorbidities  Age;Comorbidity 3+;Past/Current Experience;Time since onset of  injury/illness/exacerbation    Comorbidities  hx of CABG, HTN, renal disorder, high cholesterol, statin medication    Examination-Activity Limitations  Dressing;Stand;Bend;Sit;Lift;Sleep;Locomotion Level    Stability/Clinical Decision Making  Evolving/Moderate complexity   pain worsening per pt   Rehab Potential  Fair    PT Frequency  2x / week    PT Duration  8 weeks    PT Treatment/Interventions  Therapeutic exercise;Neuromuscular re-education;Therapeutic activities;Functional mobility training;Patient/family education;Manual techniques;Dry needling;Aquatic Therapy    PT Next Visit Plan  hip, thoracic, lumbar mobility; trunk, glute strengthening, manual techniques, modalities PRN    Consulted and Agree with Plan of Care  Patient       Patient will benefit from skilled therapeutic intervention in order to improve the following deficits and impairments:  Improper body mechanics, Pain, Postural dysfunction, Difficulty walking, Hypomobility, Decreased strength, Decreased range of motion  Visit Diagnosis: Chronic bilateral low back pain, unspecified whether sciatica present  Radiculopathy, lumbar region  Muscle weakness (generalized)  Difficulty in walking, not  elsewhere classified     Problem List Patient Active Problem List   Diagnosis Date Noted  . DDD (degenerative disc disease), lumbosacral 08/27/2018  . Bilateral carotid artery stenosis 02/20/2017  . High cholesterol 01/20/2017  . Hypertension 01/20/2017  . Hx of CABG 01/20/2017  . Carotid stenosis 01/20/2017  . Gastroesophageal reflux disease with hiatal hernia 05/03/2016  . Abnormal barium swallow 05/03/2016  . CKD (chronic kidney disease) stage 3, GFR 30-59 ml/min 07/30/2015  . Hyperglycemia 03/28/2014  . Moderate mitral insufficiency 03/21/2014  . Malignant neoplasm of prostate ([place]) 10/11/2013  . ASCVD (arteriosclerotic cardiovascular disease) 10/11/2013  . Hypothyroidism 10/11/2013    Lieutenant Diego PT,  DPT 1:46 PM,10/03/19   [facility] [address], [place], [address] Phone: [phone]   Fax:  [phone]  Name: Douglas Phelps MRN: [id number] Date of Birth: 03-12-1941

## 2019-10-08 ENCOUNTER — Ambulatory Visit: Payer: Medicare PPO

## 2019-10-08 ENCOUNTER — Other Ambulatory Visit: Payer: Self-pay

## 2019-10-08 DIAGNOSIS — G8929 Other chronic pain: Secondary | ICD-10-CM

## 2019-10-08 DIAGNOSIS — M5416 Radiculopathy, lumbar region: Secondary | ICD-10-CM

## 2019-10-08 DIAGNOSIS — M545 Low back pain: Secondary | ICD-10-CM | POA: Diagnosis not present

## 2019-10-08 DIAGNOSIS — R262 Difficulty in walking, not elsewhere classified: Secondary | ICD-10-CM

## 2019-10-08 DIAGNOSIS — M6281 Muscle weakness (generalized): Secondary | ICD-10-CM

## 2019-10-08 NOTE — Therapy (Signed)
[facility] [address] [place], [address] Phone: [phone]   Fax:  [phone]  Physical Therapy Treatment  Patient Details  Name: Douglas Phelps MRN: [id number] Date of Birth: 09/22/1940 Referring Provider (PT): Jennings Books, MD   Encounter Date: 10/08/2019  PT End of Session - 10/08/19 1352    Visit Number  6    Number of Visits  17    Date for PT Re-Evaluation  11/14/19    Authorization Type  6    Authorization Time Period  of 10 progress report    PT Start Time  1350    PT Stop Time  1430    PT Time Calculation (min)  40 min    Activity Tolerance  Patient tolerated treatment well    Behavior During Therapy  [facility] for tasks assessed/performed       Past Medical History:  Diagnosis Date  . GERD (gastroesophageal reflux disease)   . High cholesterol   . Hx of CABG   . Hypertension   . Renal disorder   . SI (sacroiliac) joint dysfunction   . Thyroid disease     Past Surgical History:  Procedure Laterality Date  . APPENDECTOMY    . CARDIAC SURGERY     CABG x 3  06/2000  . COLON SURGERY    . PROSTATE SURGERY      There were no vitals filed for this visit.  Subjective Assessment - 10/08/19 1353    Subjective  Patient reported the next several days after PT he has significant discomfort (increases to return to same level like he had never been to PT). Stated he has some relief that lasts 4-7 hours, just depends on the day.    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  Low back pain: started January 2020 which has progressively worsened. Partly due to medication (atorvastatin) and Vascepa and stopped taking them. The medications gave him myopathy. Had MRI, CT scans for his back and doctors do not know what is wrong with his back. Also feels nerve pain from low back to B LE all the way to his feet. R LE pain starts from  R low back to R groin area (around T12/L1 dermatome), then turns to the R mid thigh and travels down R distal lateral thigh and knee then turns and travels down to medial R leg to ankle (around L4/L5 dermatome). Worst pain is R low back area. L LE: starts from L low back to L LE down to L medial plantar foot along the L3/L4 dermatomal pathway.  Pt also stats having R hernia surgery 6 years ago and Dr. Doy Hutching thinks the groin pain in from his back.  Pt states having more weakness R LE compared to L.  Exercises at the [facility]ness zone at the hospital 3x/week.  Was able to ambulate 4 miles per hour for about 30 - 50 minutes prior to injury.  However, pt currently able to ambulate 2.7 mph for about 12 minutes. Was able to go on the elliptical 25-30 minutes a year ago. Pt currently unable to use the elliptical for 10 minutes. Denies bowel or bladder problems. Felt R medial proximal thigh numbness and tingling December 2020 and had an MRI afterwards. Dr. Doy Hutching had pt undergo a CT scan in January 2021 which was negative.  Had PT last year in 2020 which made  his pain worse. Stopped after 6 or 7 session. Pt had heat for his L low back, supine R and L SKTC, supine hip abduction which did not help.    Patient Stated Goals  Decrease pain, return to prior exercise level. Be able to garden.    Currently in Pain?  Yes    Pain Score  5    no thigh pain today   Pain Location  Back    Pain Orientation  Right    Pain Descriptors / Indicators  Aching    Pain Type  Chronic pain    Pain Onset  More than a month ago    Pain Frequency  Constant         Objective Blood pressure is controlled per pt.  Stability No back, hip, knee surgeries.  P to A to R L4, L5 causes R posterior hip symptoms along sciatic nerve pathway not past hip.  R lateral shift correction: centralization of R posterior hip symptoms.  Lumbar extension without L low back rotation: feels better. Eased pain off Hx of CABG   medbridge Access Code  [email or web address]     Manual therapy  Seated STM R L4 area, piriformis.    Therapeutic exercise Quad stretch in prone 2x30sec with PT assist  Supine hip IR/ER PROM x30  Supine R hip IR stretch 3x 1 minute      Improved exercise technique, movement at target joints, use of target muscles after mod verbal, visual, tactile cues.    Pt response/clinical impression:  Interventions of session shorter due to pt's desire to discuss POC, PT role, and expectations of physical therapy. PT curious about realities of condition; will it improve, how to maintain pain relief for prolonged time, and HEP. Pt strongly desires to return to gym program, but feels very limited due to back pain. Pt also mentioned upcoming injection shot, and a desire to try chiropractic measures. PT encouraged patient that he is experiencing pain relief, and goals of PT are to finalize symptom relieving interventions and prepare patient for self management of condition, he is aware complete pain relief may not be feasible. Educated on condition. Very interested in finalizing HEP moving forward to cement interventions to try at home to continue to address symptoms or when symptoms increase. Session focused on R hip mobility/restriction, pt did report reduction in symptoms from 5/10 to 3/10, but symptoms remained in R lateral hip.      PT Education - 10/08/19 1443    Education Details  therex, POC, expectations of PT, HEP    Person(s) Educated  Patient    Methods  Explanation;Demonstration;Tactile cues;Verbal cues    Comprehension  Verbalized understanding;Returned demonstration       PT Short Term Goals - 09/17/19 1819      PT SHORT TERM GOAL #1   Title  Patient will be independent with his HEP to decrease pain, improve strength and function.    Baseline  Pt has started his HEP (09/17/2019)    Time  3    Period  Weeks    Status  New    Target Date  10/10/19        PT Long Term Goals - 09/17/19 1820      PT LONG TERM  GOAL #1   Title  Patient will have a decrease in low back pain to 2/10 or less at most to promote ability to ambulate longer distances, as well as tolerate positions such as prolonged sitting.  Baseline  8/10 at most for the past 3 months (09/17/2019)    Time  8    Period  Weeks    Status  New    Target Date  11/14/19      PT LONG TERM GOAL #2   Title  Patient will report centralization of symptoms to his low back/ posterior hips to promote ability to ambulate longer distances.    Baseline  Pain radiates to B feet (09/17/2019)    Time  8    Period  Weeks    Status  New    Target Date  11/14/19      PT LONG TERM GOAL #3   Title  Patient will improve bilateral hip extension strength by at least 1/2 MMT grade to promote ability to ambulate with less difficulty and pain.    Baseline  R 3/5, L 3+/5 (09/17/2019)    Time  8    Period  Weeks    Status  New    Target Date  11/14/19      PT LONG TERM GOAL #4   Title  Pt will improve his lumbar FOTO score by at least 10 points as a demonstration of improved function.    Baseline  Lumbar FOTO: 40 (09/17/2019)    Time  8    Period  Weeks    Status  New    Target Date  11/14/19            Plan - 10/08/19 1448    Clinical Impression Statement  Interventions of session shorter due to pt's desire to discuss POC, PT role, and expectations of physical therapy. PT curious about realities of condition; will it improve, how to maintain pain relief for prolonged time, and HEP. Pt strongly desires to return to gym program, but feels very limited due to back pain. Pt also mentioned upcoming injection shot, and a desire to try chiropractic measures. PT encouraged patient that he is experiencing pain relief, and goals of PT are to finalize symptom relieving interventions and prepare patient for self management of condition, he is aware complete pain relief may not be feasible. Very interested in finalizing HEP moving forward to cement interventions to try  at home to continue to address symptoms or when symptoms increase. Session focused on R hip mobility/restriction, pt did report reduction in symptoms from 5/10 to 3/10, but symptoms remained in R lateral hip.    Personal Factors and Comorbidities  Age;Comorbidity 3+;Past/Current Experience;Time since onset of injury/illness/exacerbation    Comorbidities  hx of CABG, HTN, renal disorder, high cholesterol, statin medication    Examination-Activity Limitations  Dressing;Stand;Bend;Sit;Lift;Sleep;Locomotion Level    Stability/Clinical Decision Making  Evolving/Moderate complexity   pain worsening per pt   Rehab Potential  Fair    PT Frequency  2x / week    PT Duration  8 weeks    PT Treatment/Interventions  Therapeutic exercise;Neuromuscular re-education;Therapeutic activities;Functional mobility training;Patient/family education;Manual techniques;Dry needling;Aquatic Therapy    PT Next Visit Plan  hip, thoracic, lumbar mobility; trunk, glute strengthening, manual techniques, modalities PRN    Consulted and Agree with Plan of Care  Patient       Patient will benefit from skilled therapeutic intervention in order to improve the following deficits and impairments:  Improper body mechanics, Pain, Postural dysfunction, Difficulty walking, Hypomobility, Decreased strength, Decreased range of motion  Visit Diagnosis: Chronic bilateral low back pain, unspecified whether sciatica present  Radiculopathy, lumbar region  Muscle weakness (generalized)  Difficulty in walking,  not elsewhere classified     Problem List Patient Active Problem List   Diagnosis Date Noted  . DDD (degenerative disc disease), lumbosacral 08/27/2018  . Bilateral carotid artery stenosis 02/20/2017  . High cholesterol 01/20/2017  . Hypertension 01/20/2017  . Hx of CABG 01/20/2017  . Carotid stenosis 01/20/2017  . Gastroesophageal reflux disease with hiatal hernia 05/03/2016  . Abnormal barium swallow 05/03/2016  . CKD  (chronic kidney disease) stage 3, GFR 30-59 ml/min 07/30/2015  . Hyperglycemia 03/28/2014  . Moderate mitral insufficiency 03/21/2014  . Malignant neoplasm of prostate ([place]) 10/11/2013  . ASCVD (arteriosclerotic cardiovascular disease) 10/11/2013  . Hypothyroidism 10/11/2013    Lieutenant Diego PT, DPT 2:50 PM,10/08/19   [facility] [address], [place], [address] Phone: [phone]   Fax:  [phone]  Name: Douglas Phelps MRN: [id number] Date of Birth: 1940/07/04

## 2019-10-10 ENCOUNTER — Ambulatory Visit: Payer: Medicare PPO

## 2019-10-10 ENCOUNTER — Other Ambulatory Visit: Payer: Self-pay

## 2019-10-10 DIAGNOSIS — G8929 Other chronic pain: Secondary | ICD-10-CM

## 2019-10-10 DIAGNOSIS — M6281 Muscle weakness (generalized): Secondary | ICD-10-CM

## 2019-10-10 DIAGNOSIS — M545 Low back pain: Secondary | ICD-10-CM | POA: Diagnosis not present

## 2019-10-10 DIAGNOSIS — R262 Difficulty in walking, not elsewhere classified: Secondary | ICD-10-CM

## 2019-10-10 DIAGNOSIS — M5416 Radiculopathy, lumbar region: Secondary | ICD-10-CM

## 2019-10-10 NOTE — Patient Instructions (Signed)
Pt was recommended to perform seated manually resisted trunk extension isometrics with his wife providing resistance as well as and prone quad stretch secondary to pt reports of decreased pain with the exercises. Pt verbalized understanding.

## 2019-10-10 NOTE — Therapy (Signed)
[facility] [address] [place], [address] Phone: [phone]   Fax:  [phone]  Physical Therapy Treatment  Patient Details  Name: Douglas Phelps MRN: [id number] Date of Birth: Oct 20, 1940 Referring Provider (PT): Jennings Books, MD   Encounter Date: 10/10/2019  PT End of Session - 10/10/19 1550    Visit Number  7    Number of Visits  17    Date for PT Re-Evaluation  11/14/19    Authorization Type  7    Authorization Time Period  of 10 progress report    PT Start Time  2482    PT Stop Time  5003    PT Time Calculation (min)  51 min    Activity Tolerance  Patient tolerated treatment well    Behavior During Therapy  [facility] for tasks assessed/performed       Past Medical History:  Diagnosis Date  . GERD (gastroesophageal reflux disease)   . High cholesterol   . Hx of CABG   . Hypertension   . Renal disorder   . SI (sacroiliac) joint dysfunction   . Thyroid disease     Past Surgical History:  Procedure Laterality Date  . APPENDECTOMY    . CARDIAC SURGERY     CABG x 3  06/2000  . COLON SURGERY    . PROSTATE SURGERY      There were no vitals filed for this visit.  Subjective Assessment - 10/10/19 1551    Subjective  Had a back brace on. Did some yardwork and back is currently a 3/10. The back brace helps. Most of his LE discomfort is B anterior thighs, currently a 4/10.  Has not had a lot of groin pain.  Pt would like to see a good period of time after session with decreased discomfort. Pt has had times when pt walked out of PT with decreased pain. Scheduled for an epidural steroid injection next week on Wednesday 10/16/2019.  The R groin pain is better but has not gone away.  Wants to wait until after his back injection to schedule more PT sessions.    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  Low back pain: started January  2020 which has progressively worsened. Partly due to medication (atorvastatin) and Vascepa and stopped taking them. The medications gave him myopathy. Had MRI, CT scans for his back and doctors do not know what is wrong with his back. Also feels nerve pain from low back to B LE all the way to his feet. R LE pain starts from R low back to R groin area (around T12/L1 dermatome), then turns to the R mid thigh and travels down R distal lateral thigh and knee then turns and travels down to medial R leg to ankle (around L4/L5 dermatome). Worst pain is R low back area. L LE: starts from L low back to L LE down to L medial plantar foot along the L3/L4 dermatomal pathway.  Pt also stats having R hernia surgery 6 years ago and Dr. Doy Hutching thinks the groin pain in from his back.  Pt states having more weakness R LE compared to L.  Exerc[facility] the [facility] 3x/week.  Was able to ambulate 4 miles per hour for about 30 - 50 minutes prior to injury.  However, pt currently able to ambulate 2.7 mph for about 12  minutes. Was able to go on the elliptical 25-30 minutes a year ago. Pt currently unable to use the elliptical for 10 minutes. Denies bowel or bladder problems. Felt R medial proximal thigh numbness and tingling December 2020 and had an MRI afterwards. Dr. Doy Hutching had pt undergo a CT scan in January 2021 which was negative.  Had PT last year in 2020 which made his pain worse. Stopped after 6 or 7 session. Pt had heat for his L low back, supine R and L SKTC, supine hip abduction which did not help.    Patient Stated Goals  Decrease pain, return to prior exercise level. Be able to garden.    Currently in Pain?  Yes    Pain Score  3     Pain Onset  More than a month ago                                PT Education - 10/10/19 1751    Education Details  ther-ex    Person(s) Educated  Patient    Methods  Explanation;Demonstration;Tactile cues;Verbal cues    Comprehension   Returned demonstration;Verbalized understanding      Objective    Blood pressure is controlled per pt.     Stability  No back, hip, knee surgeries.   P to A to R L4, L5 causes R posterior hip symptoms along sciatic nerve pathway not past hip.   R lateral shift correction: centralization of R posterior hip symptoms.   Lumbar extension without L low back rotation: feels better. Eased pain off     Hx of CABG  Tramadol and tylenol helps give him relief.     medbridge Access Code [email or web address]  Therapeutic exercise  Seated manually resisted L lateral shift isometrics in neutral to counter R lateral shift 10x5 seconds for 3 sets  Then against pillow at wall in sitting 10x5 seconds for 3 sets   Seated manually resisted trunk flexion isometrics 5 seconds x 2. Incresaed symptoms  Seated manually resisted trunk extension isometrics 10x5 seconds for 3 sets . Decreased symptomes    Seated trunk extension isometrics on chair 5 seconds x 3. Increased symptoms  Seated trunk flexion to the R 10 seconds x 5  Seated B scapular retraction 10x5 seconds. No change   Pt was recommended to perform seated manually resisted trunk extension isometrics with his wife providing resistance as well as and prone quad stretch secondary to pt reports of decreased pain with the exercises. Pt verbalized understanding.     Improved exercise technique, movement at target joints, use of target muscles after mod verbal, visual, tactile cues.   Response to treatment Decreased R low back pain with activation of trunk extensor muscles in slightly flexed position.   Clinical impression Decreased R low back pain with activation of trunk extensor muscles in the slightly flexed position. Based on previous sessions and subjective reports, pt demonstrates decrease in pain after treatments. Carry over of improvement however has been difficult. Pt demonstrates slight decrease in low back pain  overall but LE symptoms remain the same. Pt might benefit from increased repetition or sets of the exercises which help. Pt will benefit from continued skilled physical therapy services to decrease pain, improve strength and function.      PT Short Term Goals - 10/10/19 1804      PT SHORT TERM GOAL #1   Title  Patient will be independent with  his HEP to decrease pain, improve strength and function.    Baseline  Pt has started his HEP (09/17/2019); Pt performing his HEP (10/10/2019)    Time  3    Period  Weeks    Status  On-going    Target Date  10/10/19        PT Long Term Goals - 10/10/19 1558      PT LONG TERM GOAL #1   Title  Patient will have a decrease in low back pain to 2/10 or less at most to promote ability to ambulate longer distances, as well as tolerate positions such as prolonged sitting.    Baseline  8/10 at most for the past 3 months (09/17/2019), 6/10 at most for the past 7 days (Pt took tramadol and tylenol; also took the medication prior to PT) (10/10/2019)    Time  8    Period  Weeks    Status  Partially Met    Target Date  11/14/19      PT LONG TERM GOAL #2   Title  Patient will report centralization of symptoms to his low back/ posterior hips to promote ability to ambulate longer distances.    Baseline  Pain radiates to B feet (09/17/2019), (10/10/2019)    Time  8    Period  Weeks    Status  On-going    Target Date  11/14/19      PT LONG TERM GOAL #3   Title  Patient will improve bilateral hip extension strength by at least 1/2 MMT grade to promote ability to ambulate with less difficulty and pain.    Baseline  R 3/5, L 3+/5 (09/17/2019)    Time  8    Period  Weeks    Status  On-going    Target Date  11/14/19      PT LONG TERM GOAL #4   Title  Pt will improve his lumbar FOTO score by at least 10 points as a demonstration of improved function.    Baseline  Lumbar FOTO: 40 (09/17/2019)    Time  8    Period  Weeks    Status  On-going    Target Date   11/14/19            Plan - 10/10/19 1756    Clinical Impression Statement  Decreased R low back pain with activation of trunk extensor muscles in the slightly flexed position. Based on previous sessions and subjective reports, pt demonstrates decrease in pain after treatments. Carry over of improvement however has been difficult. Pt demonstrates slight decrease in low back pain overall but LE symptoms remain the same. Pt might benefit from increased repetition or sets of the exercises which help. Pt will benefit from continued skilled physical therapy services to decrease pain, improve strength and function.    Personal Factors and Comorbidities  Age;Comorbidity 3+;Past/Current Experience;Time since onset of injury/illness/exacerbation    Comorbidities  hx of CABG, HTN, renal disorder, high cholesterol, statin medication    Examination-Activity Limitations  Dressing;Stand;Bend;Sit;Lift;Sleep;Locomotion Level    Stability/Clinical Decision Making  Evolving/Moderate complexity   pain worsening per pt   Rehab Potential  Fair    PT Frequency  2x / week    PT Duration  8 weeks    PT Treatment/Interventions  Therapeutic exercise;Neuromuscular re-education;Therapeutic activities;Functional mobility training;Patient/family education;Manual techniques;Dry needling;Aquatic Therapy    PT Next Visit Plan  hip, thoracic, lumbar mobility; trunk, glute strengthening, manual techniques, modalities PRN    Consulted and Agree  with Plan of Care  Patient       Patient will benefit from skilled therapeutic intervention in order to improve the following deficits and impairments:  Improper body mechanics, Pain, Postural dysfunction, Difficulty walking, Hypomobility, Decreased strength, Decreased range of motion  Visit Diagnosis: Chronic bilateral low back pain, unspecified whether sciatica present  Radiculopathy, lumbar region  Muscle weakness (generalized)  Difficulty in walking, not elsewhere  classified     Problem List Patient Active Problem List   Diagnosis Date Noted  . DDD (degenerative disc disease), lumbosacral 08/27/2018  . Bilateral carotid artery stenosis 02/20/2017  . High cholesterol 01/20/2017  . Hypertension 01/20/2017  . Hx of CABG 01/20/2017  . Carotid stenosis 01/20/2017  . Gastroesophageal reflux disease with hiatal hernia 05/03/2016  . Abnormal barium swallow 05/03/2016  . CKD (chronic kidney disease) stage 3, GFR 30-59 ml/min 07/30/2015  . Hyperglycemia 03/28/2014  . Moderate mitral insufficiency 03/21/2014  . Malignant neoplasm of prostate ([place]) 10/11/2013  . ASCVD (arteriosclerotic cardiovascular disease) 10/11/2013  . Hypothyroidism 10/11/2013    Joneen Boers PT, DPT   10/10/2019, 6:06 PM  [facility] [address] [place], [address] Phone: [phone]   Fax:  [phone]  Name: Douglas Phelps MRN: [id number] Date of Birth: 13-Sep-1940

## 2020-07-31 ENCOUNTER — Ambulatory Visit (INDEPENDENT_AMBULATORY_CARE_PROVIDER_SITE_OTHER): Payer: Medicare PPO

## 2020-07-31 ENCOUNTER — Ambulatory Visit (INDEPENDENT_AMBULATORY_CARE_PROVIDER_SITE_OTHER): Payer: Medicare PPO | Admitting: Vascular Surgery

## 2020-07-31 ENCOUNTER — Encounter (INDEPENDENT_AMBULATORY_CARE_PROVIDER_SITE_OTHER): Payer: Self-pay | Admitting: Vascular Surgery

## 2020-07-31 ENCOUNTER — Other Ambulatory Visit: Payer: Self-pay

## 2020-07-31 VITALS — BP 121/65 | HR 71 | Resp 16 | Wt 173.4 lb

## 2020-07-31 DIAGNOSIS — Z951 Presence of aortocoronary bypass graft: Secondary | ICD-10-CM | POA: Diagnosis not present

## 2020-07-31 DIAGNOSIS — I6523 Occlusion and stenosis of bilateral carotid arteries: Secondary | ICD-10-CM

## 2020-07-31 DIAGNOSIS — E7800 Pure hypercholesterolemia, unspecified: Secondary | ICD-10-CM

## 2020-07-31 DIAGNOSIS — I10 Essential (primary) hypertension: Secondary | ICD-10-CM | POA: Diagnosis not present

## 2020-07-31 NOTE — Progress Notes (Signed)
MRN : [id number]  Douglas Phelps is a 80 y.o. ([birth year]) male who presents with chief complaint of  Chief Complaint  Patient presents with  . Carotid    81yr ultrasound follow up  .  History of Present Illness:  Patient returns in follow-up of his carotid disease.  He is doing well today without any major complaints.  He denies any recent focal neurologic symptoms. Specifically, the patient denies amaurosis fugax, speech or swallowing difficulties, or arm or leg weakness or numbness.  Duplex today shows slight progression of his carotid disease on each side now on the lower end of the 40 to 59% range bilaterally.  Current Outpatient Medications  Medication Sig Dispense Refill  . acetaminophen (TYLENOL) 500 MG tablet Take 500 mg by mouth every 6 (six) hours as needed.    Marland Kitchen amLODipine (NORVASC) 5 MG tablet Take 5 mg by mouth 2 (two) times daily.     . Ascorbic Acid (VITAMIN C) 1000 MG tablet Take 1,000 mg by mouth daily.     Marland Kitchen aspirin EC 81 MG tablet Take 81 mg by mouth daily.     . calcium carbonate (OS-CAL) 600 MG TABS tablet Take 600 mg by mouth 2 (two) times daily with a meal.     . esomeprazole (NEXIUM) 40 MG capsule Take 40 mg by mouth every other day.     . famotidine (PEPCID) 20 MG tablet Take 20 mg by mouth 2 (two) times daily.    Marland Kitchen levothyroxine (SYNTHROID) 125 MCG tablet Take 125 mcg by mouth daily before breakfast.     . loratadine (CLARITIN) 10 MG tablet Take 10 mg by mouth daily as needed for allergies.    . Magnesium Oxide 500 MG TABS Take 500 mg by mouth daily.     . metoprolol succinate (TOPROL-XL) 50 MG 24 hr tablet Take 50 mg by mouth 2 (two) times daily.     . Multiple Vitamin (MULTI-VITAMINS) TABS Take 1 tablet by mouth daily.     . sildenafil (REVATIO) 20 MG tablet 5 tabs po QHS as needed    . traMADol (ULTRAM) 50 MG tablet Take 50 mg by mouth every 6 (six) hours as needed.    . Vitamin D, Cholecalciferol, 400 units TABS Take 400 Units by mouth daily.     Marland Kitchen  doxazosin (CARDURA) 4 MG tablet Take by mouth.    Marland Kitchen LIVALO 2 MG TABS     . losartan (COZAAR) 100 MG tablet Take 100 mg by mouth daily.      No current facility-administered medications for this visit.    Past Medical History:  Diagnosis Date  . GERD (gastroesophageal reflux disease)   . High cholesterol   . Hx of CABG   . Hypertension   . Renal disorder   . SI (sacroiliac) joint dysfunction   . Thyroid disease     Past Surgical History:  Procedure Laterality Date  . APPENDECTOMY    . CARDIAC SURGERY     CABG x 3  06/2000  . COLON SURGERY    . PROSTATE SURGERY       Family History No bleeding disorders, clotting disorders, autoimmune diseases, or aneurysms  Social History     Social History  Substance Use Topics  . Smoking status: Never Smoker  . Smokeless tobacco: Never Used  . Alcohol use No  No IVDU       Allergies  Allergen Reactions  . Lotensin [Benazepril] Other (See Comments)  Other reaction(s): Other (See Comments) cough Cough   . Prilosec [Omeprazole] Other (See Comments)    Other reaction(s): Other (See Comments) headache headache  . Benadryl [Diphenhydramine Hcl (Sleep)] Other (See Comments)    Jittery   . Hydralazine Hcl     Other reaction(s): Kidney Disorder      REVIEW OF SYSTEMS(Negative unless checked)  Constitutional: [] ???Weight loss[] ???Fever[] ???Chills Cardiac:[] ???Chest pain[] ???Chest pressure[] ???Palpitations [] ???Shortness of breath when laying flat [] ???Shortness of breath at rest [] ???Shortness of breath with exertion. Vascular: [] ???Pain in legs with walking[] ???Pain in legsat rest[] ???Pain in legs when laying flat [] ???Claudication [] ???Pain in feet when walking [] ???Pain in feet at rest [] ???Pain in feet when laying flat [] ???History of DVT [] ???Phlebitis [] ???Swelling in legs [] ???Varicose veins [] ???Non-healing ulcers Pulmonary: [] ???Uses home oxygen  [] ???Productive cough[] ???Hemoptysis [] ???Wheeze [] ???COPD [] ???Asthma Neurologic: [] ???Dizziness [] ???Blackouts [] ???Seizures [] ???History of stroke [] ???History of TIA[] ???Aphasia [] ???Temporary blindness[] ???Dysphagia [] ???Weaknessor numbness in arms [] ???Weakness or numbnessin legs Musculoskeletal: [x] ???Arthritis [] ???Joint swelling [] ???Joint pain [] ???Low back pain Hematologic:[] ???Easy bruising[] ???Easy bleeding [] ???Hypercoagulable state [] ???Anemic [] ???Hepatitis Gastrointestinal:[] ???Blood in stool[] ???Vomiting blood[] ???Gastroesophageal reflux/heartburn[] ???Abdominal pain Genitourinary: [] ???Chronic kidney disease [] ???Difficulturination [] ???Frequenturination [] ???Burning with urination[] ???Hematuria Skin: [] ???Rashes [] ???Ulcers [] ???Wounds Psychological: [] ???History of anxiety[] ???History of major depression.     Physical Examination  Vitals:   07/31/20 0955  BP: 121/65  Pulse: 71  Resp: 16  Weight: 173 lb 6.4 oz (78.7 kg)   Body mass index is 27.99 kg/m. Gen:  WD/WN, NAD Head: [place]/AT, No temporalis wasting. Ear/Nose/Throat: Hearing grossly intact, nares w/o erythema or drainage, trachea midline Eyes: Conjunctiva clear. Sclera non-icteric Neck: Supple.  No bruit  Pulmonary:  Good air movement, equal and clear to auscultation bilaterally.  Cardiac: RRR, No JVD Vascular:  Vessel Right Left  Radial Palpable Palpable               Musculoskeletal: M/S 5/5 throughout.  No deformity or atrophy. No edema. Neurologic: CN 2-12 intact. Sensation grossly intact in extremities.  Symmetrical.  Speech is fluent. Motor exam as listed above. Psychiatric: Judgment intact, Mood & affect appropriate for pt's clinical situation. Dermatologic: No rashes or ulcers noted.  No cellulitis or open wounds.      CBC Lab Results  Component Value Date   WBC 6.4 05/28/2019   HGB 12.5 (L) 05/28/2019   HCT  36.3 (L) 05/28/2019   MCV 97.1 05/28/2019   PLT 165 05/28/2019    BMET    Component Value Date/Time   NA 131 (L) 05/28/2019 1750   NA 134 (L) 01/26/2013 1511   K 4.3 05/28/2019 1750   K 4.8 01/26/2013 1511   CL 98 05/28/2019 1750   CL 103 01/26/2013 1511   CO2 23 05/28/2019 1750   CO2 25 01/26/2013 1511   GLUCOSE 136 (H) 05/28/2019 1750   GLUCOSE 133 (H) 01/26/2013 1511   BUN 24 (H) 05/28/2019 1750   BUN 30 (H) 01/26/2013 1511   CREATININE 1.39 (H) 05/28/2019 1750   CREATININE 1.52 (H) 01/26/2013 1511   CALCIUM 9.5 05/28/2019 1750   CALCIUM 9.3 01/26/2013 1511   GFRNONAA 49 (L) 05/28/2019 1750   GFRNONAA 45 (L) 01/26/2013 1511   GFRAA 56 (L) 05/28/2019 1750   GFRAA 53 (L) 01/26/2013 1511   CrCl cannot be calculated (Patient's most recent lab result is older than the maximum 21 days allowed.).  COAG No results found for: INR, PROTIME  Radiology No results found.   Assessment/Plan High cholesterol lipid control important in reducing the progression of atherosclerotic disease. Continue statin therapy   Hypertension blood pressure control important in reducing  the progression of atherosclerotic disease. On appropriate oral medications.   Hx of CABG No recent anginal symptoms  Carotid stenosis Duplex today shows slight progression of his carotid disease on each side now on the lower end of the 40 to 59% range bilaterally.  He is doing well.  Continue current medical regimen.  Recheck in 1 year.    Leotis Pain, MD  07/31/2020 10:07 AM    This note was created with Dragon medical transcription system.  Any errors from dictation are purely unintentional

## 2020-07-31 NOTE — Assessment & Plan Note (Signed)
Duplex today shows slight progression of his carotid disease on each side now on the lower end of the 40 to 59% range bilaterally.  He is doing well.  Continue current medical regimen.  Recheck in 1 year.

## 2020-08-20 ENCOUNTER — Encounter: Payer: Self-pay | Admitting: Dermatology

## 2020-08-20 ENCOUNTER — Other Ambulatory Visit: Payer: Self-pay

## 2020-08-20 ENCOUNTER — Ambulatory Visit (INDEPENDENT_AMBULATORY_CARE_PROVIDER_SITE_OTHER): Payer: Medicare PPO | Admitting: Nurse Practitioner

## 2020-08-20 ENCOUNTER — Ambulatory Visit (INDEPENDENT_AMBULATORY_CARE_PROVIDER_SITE_OTHER): Payer: Medicare PPO | Admitting: Dermatology

## 2020-08-20 DIAGNOSIS — L814 Other melanin hyperpigmentation: Secondary | ICD-10-CM | POA: Diagnosis not present

## 2020-08-20 DIAGNOSIS — Z1283 Encounter for screening for malignant neoplasm of skin: Secondary | ICD-10-CM

## 2020-08-20 DIAGNOSIS — L570 Actinic keratosis: Secondary | ICD-10-CM

## 2020-08-20 DIAGNOSIS — Z872 Personal history of diseases of the skin and subcutaneous tissue: Secondary | ICD-10-CM | POA: Diagnosis not present

## 2020-08-20 DIAGNOSIS — D229 Melanocytic nevi, unspecified: Secondary | ICD-10-CM

## 2020-08-20 DIAGNOSIS — L578 Other skin changes due to chronic exposure to nonionizing radiation: Secondary | ICD-10-CM | POA: Diagnosis not present

## 2020-08-20 DIAGNOSIS — D1800 Hemangioma unspecified site: Secondary | ICD-10-CM

## 2020-08-20 DIAGNOSIS — L918 Other hypertrophic disorders of the skin: Secondary | ICD-10-CM

## 2020-08-20 DIAGNOSIS — L821 Other seborrheic keratosis: Secondary | ICD-10-CM

## 2020-08-20 NOTE — Progress Notes (Signed)
   Follow-Up Visit   Subjective  Douglas Phelps is a 80 y.o. male who presents for the following: Annual Exam (Hx AK's, ISK's ). The patient presents for Total-Body Skin Exam (TBSE) for skin cancer screening and mole check.  The following portions of the chart were reviewed this encounter and updated as appropriate:   Tobacco  Allergies  Meds  Problems  Med Hx  Surg Hx  Fam Hx     Review of Systems:  No other skin or systemic complaints except as noted in HPI or Assessment and Plan.  Objective  Well appearing patient in no apparent distress; mood and affect are within normal limits.  A full examination was performed including scalp, head, eyes, ears, nose, lips, neck, chest, axillae, abdomen, back, buttocks, bilateral upper extremities, bilateral lower extremities, hands, feet, fingers, toes, fingernails, and toenails. All findings within normal limits unless otherwise noted below.  Objective  Scalp and face x 12 (12): Erythematous thin papules/macules with gritty scale.    Assessment & Plan  AK (actinic keratosis) (12) Scalp and face x 12  Destruction of lesion - Scalp and face x 12 Complexity: simple   Destruction method: cryotherapy   Informed consent: discussed and consent obtained   Timeout:  patient name, date of birth, surgical site, and procedure verified Lesion destroyed using liquid nitrogen: Yes   Region frozen until ice ball extended beyond lesion: Yes   Outcome: patient tolerated procedure well with no complications   Post-procedure details: wound care instructions given    Skin cancer screening   Lentigines - Scattered tan macules - Due to sun exposure - Benign-appering, observe - Recommend daily broad spectrum sunscreen SPF 30+ to sun-exposed areas, reapply every 2 hours as needed. - Call for any changes  Seborrheic Keratoses - Stuck-on, waxy, tan-brown papules and plaques  - Discussed benign etiology and prognosis. - Observe - Call for any  changes  Melanocytic Nevi - Tan-brown and/or pink-flesh-colored symmetric macules and papules - Benign appearing on exam today - Observation - Call clinic for new or changing moles - Recommend daily use of broad spectrum spf 30+ sunscreen to sun-exposed areas.   Hemangiomas - Red papules - Discussed benign nature - Observe - Call for any changes  Actinic Damage - Chronic, secondary to cumulative UV/sun exposure - diffuse scaly erythematous macules with underlying dyspigmentation - Recommend daily broad spectrum sunscreen SPF 30+ to sun-exposed areas, reapply every 2 hours as needed.  - Call for new or changing lesions.  Acrochordons (Skin Tags) - Fleshy, skin-colored pedunculated papules - Benign appearing.  - Observe. - If desired, they can be removed with an in office procedure that is not covered by insurance. - Please call the clinic if you notice any new or changing lesions.  Skin cancer screening performed today.  Return in about 1 year (around 08/20/2021) for TBSE - Hx AK's, ISK's .  Luther Redo, CMA, am acting as scribe for Sarina Ser, MD .  Documentation: I have reviewed the above documentation for accuracy and completeness, and I agree with the above.  Sarina Ser, MD

## 2020-08-20 NOTE — Patient Instructions (Signed)

## 2020-08-25 ENCOUNTER — Encounter: Payer: Self-pay | Admitting: Dermatology

## 2020-10-12 ENCOUNTER — Other Ambulatory Visit: Payer: Self-pay | Admitting: Student

## 2020-10-12 DIAGNOSIS — M4316 Spondylolisthesis, lumbar region: Secondary | ICD-10-CM

## 2020-10-23 ENCOUNTER — Ambulatory Visit: Payer: Medicare PPO

## 2020-12-14 ENCOUNTER — Other Ambulatory Visit: Payer: Self-pay

## 2020-12-14 ENCOUNTER — Ambulatory Visit
Admission: RE | Admit: 2020-12-14 | Discharge: 2020-12-14 | Disposition: A | Discharge status: Home / Self Care | Payer: Medicare PPO | Source: Ambulatory Visit | Attending: Student | Admitting: Student

## 2020-12-14 DIAGNOSIS — M4316 Spondylolisthesis, lumbar region: Secondary | ICD-10-CM | POA: Diagnosis not present

## 2021-07-30 ENCOUNTER — Ambulatory Visit (INDEPENDENT_AMBULATORY_CARE_PROVIDER_SITE_OTHER): Payer: Managed Care, Other (non HMO) | Admitting: Vascular Surgery

## 2021-07-30 ENCOUNTER — Ambulatory Visit (INDEPENDENT_AMBULATORY_CARE_PROVIDER_SITE_OTHER): Payer: Medicare PPO

## 2021-07-30 ENCOUNTER — Other Ambulatory Visit: Payer: Self-pay

## 2021-07-30 ENCOUNTER — Encounter (INDEPENDENT_AMBULATORY_CARE_PROVIDER_SITE_OTHER): Payer: Self-pay

## 2021-07-30 DIAGNOSIS — I6523 Occlusion and stenosis of bilateral carotid arteries: Secondary | ICD-10-CM | POA: Diagnosis not present

## 2021-08-10 ENCOUNTER — Encounter (INDEPENDENT_AMBULATORY_CARE_PROVIDER_SITE_OTHER): Payer: Self-pay | Admitting: *Deleted

## 2021-08-26 ENCOUNTER — Ambulatory Visit (INDEPENDENT_AMBULATORY_CARE_PROVIDER_SITE_OTHER): Payer: Medicare PPO | Admitting: Dermatology

## 2021-08-26 DIAGNOSIS — L820 Inflamed seborrheic keratosis: Secondary | ICD-10-CM | POA: Diagnosis not present

## 2021-08-26 DIAGNOSIS — L814 Other melanin hyperpigmentation: Secondary | ICD-10-CM

## 2021-08-26 DIAGNOSIS — L817 Pigmented purpuric dermatosis: Secondary | ICD-10-CM | POA: Diagnosis not present

## 2021-08-26 DIAGNOSIS — L578 Other skin changes due to chronic exposure to nonionizing radiation: Secondary | ICD-10-CM

## 2021-08-26 DIAGNOSIS — L821 Other seborrheic keratosis: Secondary | ICD-10-CM | POA: Diagnosis not present

## 2021-08-26 DIAGNOSIS — Z1283 Encounter for screening for malignant neoplasm of skin: Secondary | ICD-10-CM | POA: Diagnosis not present

## 2021-08-26 DIAGNOSIS — L570 Actinic keratosis: Secondary | ICD-10-CM | POA: Diagnosis not present

## 2021-08-26 DIAGNOSIS — D1800 Hemangioma unspecified site: Secondary | ICD-10-CM

## 2021-08-26 DIAGNOSIS — D229 Melanocytic nevi, unspecified: Secondary | ICD-10-CM

## 2021-08-26 NOTE — Progress Notes (Signed)
? ?Follow-Up Visit ?  ?Subjective  ?Douglas Phelps is a 81 y.o. male who presents for the following: Annual Exam (The patient presents for Total-Body Skin Exam (TBSE) for skin cancer screening and mole check.  The patient has spots, moles and lesions to be evaluated, some may be new or changing and the patient has concerns that these could be cancer. ). ? ?The following portions of the chart were reviewed this encounter and updated as appropriate:  ? Tobacco  Allergies  Meds  Problems  Med Hx  Surg Hx  Fam Hx   ?  ?Review of Systems:  No other skin or systemic complaints except as noted in HPI or Assessment and Plan. ? ?Objective  ?Well appearing patient in no apparent distress; mood and affect are within normal limits. ? ?A full examination was performed including scalp, head, eyes, ears, nose, lips, neck, chest, axillae, abdomen, back, buttocks, bilateral upper extremities, bilateral lower extremities, hands, feet, fingers, toes, fingernails, and toenails. All findings within normal limits unless otherwise noted below. ? ?left temple, left neck x 2 (2) ?Stuck-on, waxy, tan-brown papules ? ?Scalp x 2 (2) ?Erythematous thin papules/macules with gritty scale.  ? ?lower legs ?Erythematous, scaly patches involving the ankle and distal lower legs.  ? ? ?Assessment & Plan  ?Inflamed seborrheic keratosis (2) ?left temple, left neck x 2 ?Reassured benign age-related growth.  Recommend observation.  Discussed cryotherapy if spot(s) become irritated or inflamed.  ?These are irritated and patient would like treated. ?Destruction of lesion - left temple, left neck x 2 ?Complexity: simple   ?Destruction method: cryotherapy   ?Informed consent: discussed and consent obtained   ?Timeout:  patient name, date of birth, surgical site, and procedure verified ?Lesion destroyed using liquid nitrogen: Yes   ?Region frozen until ice ball extended beyond lesion: Yes   ?Outcome: patient tolerated procedure well with no  complications   ?Post-procedure details: wound care instructions given   ? ?AK (actinic keratosis) (2) ?Scalp x 2 ?Actinic keratoses are precancerous spots that appear secondary to cumulative UV radiation exposure/sun exposure over time. They are chronic with expected duration over 1 year. A portion of actinic keratoses will progress to squamous cell carcinoma of the skin. It is not possible to reliably predict which spots will progress to skin cancer and so treatment is recommended to prevent development of skin cancer. ? ?Recommend daily broad spectrum sunscreen SPF 30+ to sun-exposed areas, reapply every 2 hours as needed.  ?Recommend staying in the shade or wearing long sleeves, sun glasses (UVA+UVB protection) and wide brim hats (4-inch brim around the entire circumference of the hat). ?Call for new or changing lesions.  ? ?Destruction of lesion - Scalp x 2 ?Complexity: simple   ?Destruction method: cryotherapy   ?Informed consent: discussed and consent obtained   ?Timeout:  patient name, date of birth, surgical site, and procedure verified ?Lesion destroyed using liquid nitrogen: Yes   ?Region frozen until ice ball extended beyond lesion: Yes   ?Outcome: patient tolerated procedure well with no complications   ?Post-procedure details: wound care instructions given   ? ?Schamberg's purpura with stasis dermatitis ?lower legs ?Erythematous, scaly patches involving the ankle and distal lower leg with associated lower leg edema. ?Stasis in the legs causes chronic leg swelling, which may result in itchy or painful rashes, skin discoloration, skin texture changes, and sometimes ulceration.  Recommend daily graduated compression hose/stockings- easiest to put on first thing in morning, remove at bedtime.  Elevate legs as  much as possible. Avoid salt/sodium rich foods. ? ?Skin cancer screening ? ?Lentigines ?- Scattered tan macules ?- Due to sun exposure ?- Benign-appearing, observe ?- Recommend daily broad spectrum  sunscreen SPF 30+ to sun-exposed areas, reapply every 2 hours as needed. ?- Call for any changes ? ?Seborrheic Keratoses ?- Stuck-on, waxy, tan-brown papules and/or plaques  ?- Benign-appearing ?- Discussed benign etiology and prognosis. ?- Observe ?- Call for any changes ? ?Melanocytic Nevi ?- Tan-brown and/or pink-flesh-colored symmetric macules and papules ?- Benign appearing on exam today ?- Observation ?- Call clinic for new or changing moles ?- Recommend daily use of broad spectrum spf 30+ sunscreen to sun-exposed areas.  ? ?Hemangiomas ?- Red papules ?- Discussed benign nature ?- Observe ?- Call for any changes ? ?Actinic Damage ?- Chronic condition, secondary to cumulative UV/sun exposure ?- diffuse scaly erythematous macules with underlying dyspigmentation ?- Recommend daily broad spectrum sunscreen SPF 30+ to sun-exposed areas, reapply every 2 hours as needed.  ?- Staying in the shade or wearing long sleeves, sun glasses (UVA+UVB protection) and wide brim hats (4-inch brim around the entire circumference of the hat) are also recommended for sun protection.  ?- Call for new or changing lesions. ? ?Skin cancer screening performed today.  ? ?Return in about 1 year (around 08/27/2022) for TBSE, AKs . ? ?I, Marye Round, CMA, am acting as scribe for Sarina Ser, MD .  ?Documentation: I have reviewed the above documentation for accuracy and completeness, and I agree with the above. ? ?Sarina Ser, MD ? ?

## 2021-08-26 NOTE — Patient Instructions (Addendum)

## 2021-08-27 ENCOUNTER — Encounter: Payer: Self-pay | Admitting: Dermatology

## 2021-11-04 ENCOUNTER — Other Ambulatory Visit: Payer: Self-pay | Admitting: Neurosurgery

## 2022-01-06 NOTE — Pre-Procedure Instructions (Signed)
Surgical Instructions    Your procedure is scheduled on January 17, 2022.  Report to [facility] Main Entrance "A" at 5:30 A.M., then check in with the Admitting office.  Call this number if you have problems the morning of surgery:  [phone]   If you have any questions prior to your surgery date call [phone]: Open Monday-Friday 8am-4pm    Remember:  Do not eat or drink after midnight the night before your surgery     Take these medicines the morning of surgery with A SIP OF WATER:  amLODipine (NORVASC)  docusate sodium (COLACE)   doxazosin (CARDURA)  esomeprazole (NEXIUM)  famotidine (PEPCID)  levothyroxine (SYNTHROID)   loratadine (CLARITIN)  metoprolol succinate (TOPROL-XL)  traMADol (ULTRAM)     Follow your surgeon's instructions on when to stop Aspirin.  If no instructions were given by your surgeon then you will need to call the office to get those instructions.    As of today, STOP taking any Aleve, Naproxen, Ibuprofen, Motrin, Advil, Goody's, BC's, all herbal medications, fish oil, and all vitamins.                     Do NOT Smoke (Tobacco/Vaping) for 24 hours prior to your procedure.  If you use a CPAP at night, you may bring your mask/headgear for your overnight stay.   Contacts, glasses, piercing's, hearing aid's, dentures or partials may not be worn into surgery, please bring cases for these belongings.    For patients admitted to the hospital, discharge time will be determined by your treatment team.   Patients discharged the day of surgery will not be allowed to drive home, and someone needs to stay with them for 24 hours.  SURGICAL WAITING ROOM VISITATION Patients having surgery or a procedure may have no more than 2 support people in the waiting area - these visitors may rotate.   Children under the age of 100 must have an adult with them who is not the patient. If the patient needs to stay at the hospital during part of their recovery, the visitor  guidelines for inpatient rooms apply. Pre-op nurse will coordinate an appropriate time for 1 support person to accompany patient in pre-op.  This support person may not rotate.   Please refer to the [facility] website for the visitor guidelines for Inpatients (after your surgery is over and you are in a regular room).    Special instructions:   [facility]- Preparing For Surgery  Before surgery, you can play an important role. Because skin is not sterile, your skin needs to be as free of germs as possible. You can reduce the number of germs on your skin by washing with CHG (chlorahexidine gluconate) Soap before surgery.  CHG is an antiseptic cleaner which kills germs and bonds with the skin to continue killing germs even after washing.    Oral Hygiene is also important to reduce your risk of infection.  Remember - BRUSH YOUR TEETH THE MORNING OF SURGERY WITH YOUR REGULAR TOOTHPASTE  Please do not use if you have an allergy to CHG or antibacterial soaps. If your skin becomes reddened/irritated stop using the CHG.  Do not shave (including legs and underarms) for at least 48 hours prior to first CHG shower. It is OK to shave your face.  Please follow these instructions carefully.   Shower the NIGHT BEFORE SURGERY and the MORNING OF SURGERY  If you chose to wash your hair, wash your hair first as  usual with your normal shampoo.  After you shampoo, rinse your hair and body thoroughly to remove the shampoo.  Use CHG Soap as you would any other liquid soap. You can apply CHG directly to the skin and wash gently with a scrungie or a clean washcloth.   Apply the CHG Soap to your body ONLY FROM THE NECK DOWN.  Do not use on open wounds or open sores. Avoid contact with your eyes, ears, mouth and genitals (private parts). Wash Face and genitals (private parts)  with your normal soap.   Wash thoroughly, paying special attention to the area where your surgery will be performed.  Thoroughly rinse  your body with warm water from the neck down.  DO NOT shower/wash with your normal soap after using and rinsing off the CHG Soap.  Pat yourself dry with a CLEAN TOWEL.  Wear CLEAN PAJAMAS to bed the night before surgery  Place CLEAN SHEETS on your bed the night before your surgery  DO NOT SLEEP WITH PETS.   Day of Surgery: Take a shower with CHG soap. Do not wear jewelry or makeup Do not wear lotions, powders, perfumes/colognes, or deodorant. Do not shave 48 hours prior to surgery.  Men may shave face and neck. Do not bring valuables to the hospital.  [facility] is not responsible for any belongings or valuables. Do not wear nail polish, gel polish, artificial nails, or any other type of covering on natural nails (fingers and toes) If you have artificial nails or gel coating that need to be removed by a nail salon, please have this removed prior to surgery. Artificial nails or gel coating may interfere with anesthesia's ability to adequately monitor your vital signs.  Wear Clean/Comfortable clothing the morning of surgery Remember to brush your teeth WITH YOUR REGULAR TOOTHPASTE.   Please read over the following fact sheets that you were given.    If you received a COVID test during your pre-op visit  it is requested that you wear a mask when out in public, stay away from anyone that may not be feeling well and notify your surgeon if you develop symptoms. If you have been in contact with anyone that has tested positive in the last 10 days please notify you surgeon.

## 2022-01-07 ENCOUNTER — Encounter (HOSPITAL_COMMUNITY)
Admission: RE | Admit: 2022-01-07 | Discharge: 2022-01-07 | Disposition: A | Discharge status: Home / Self Care | Payer: Medicare PPO | Source: Ambulatory Visit | Attending: Neurosurgery | Admitting: Neurosurgery

## 2022-01-07 ENCOUNTER — Other Ambulatory Visit: Payer: Self-pay

## 2022-01-07 ENCOUNTER — Encounter (HOSPITAL_COMMUNITY): Payer: Self-pay

## 2022-01-07 VITALS — BP 145/55 | HR 63 | Temp 98.1°F | Resp 17 | Ht 66.0 in | Wt 163.7 lb

## 2022-01-07 DIAGNOSIS — Z01812 Encounter for preprocedural laboratory examination: Secondary | ICD-10-CM | POA: Insufficient documentation

## 2022-01-07 DIAGNOSIS — Z01818 Encounter for other preprocedural examination: Secondary | ICD-10-CM

## 2022-01-07 HISTORY — DX: Personal history of other diseases of the digestive system: Z87.19

## 2022-01-07 HISTORY — DX: Ventricular premature depolarization: I49.3

## 2022-01-07 HISTORY — DX: Other specified postprocedural states: Z98.890

## 2022-01-07 HISTORY — DX: Unspecified osteoarthritis, unspecified site: M19.90

## 2022-01-07 HISTORY — DX: Hypothyroidism, unspecified: E03.9

## 2022-01-07 LAB — CBC
HCT: 36.4 % — ABNORMAL LOW (ref 39.0–52.0)
Hemoglobin: 12.4 g/dL — ABNORMAL LOW (ref 13.0–17.0)
MCH: 33.4 pg (ref 26.0–34.0)
MCHC: 34.1 g/dL (ref 30.0–36.0)
MCV: 98.1 fL (ref 80.0–100.0)
Platelets: 146 10*3/uL — ABNORMAL LOW (ref 150–400)
RBC: 3.71 MIL/uL — ABNORMAL LOW (ref 4.22–5.81)
RDW: 12.8 % (ref 11.5–15.5)
WBC: 4.7 10*3/uL (ref 4.0–10.5)
nRBC: 0 % (ref 0.0–0.2)

## 2022-01-07 LAB — BASIC METABOLIC PANEL
Anion gap: 6 (ref 5–15)
BUN: 22 mg/dL (ref 8–23)
CO2: 25 mmol/L (ref 22–32)
Calcium: 9.4 mg/dL (ref 8.9–10.3)
Chloride: 104 mmol/L (ref 98–111)
Creatinine, Ser: 1.34 mg/dL — ABNORMAL HIGH (ref 0.61–1.24)
GFR, Estimated: 54 mL/min — ABNORMAL LOW (ref >60)
Glucose, Bld: 100 mg/dL — ABNORMAL HIGH (ref 70–99)
Potassium: 5 mmol/L (ref 3.5–5.1)
Sodium: 135 mmol/L (ref 135–145)

## 2022-01-07 LAB — SURGICAL PCR SCREEN
MRSA, PCR: NEGATIVE
Staphylococcus aureus: POSITIVE — AB

## 2022-01-07 LAB — TYPE AND SCREEN
ABO/RH(D): O POS
Antibody Screen: NEGATIVE

## 2022-01-07 NOTE — Progress Notes (Signed)
PCP - Fulton Reek MD Cardiologist - Serafina Royals MD  PPM/ICD - denies Device Orders -  Rep Notified -   Chest x-ray - 05/28/19 EKG - 08/30/21 Stress Test - 08/30/21 ECHO - 08/30/21 Cardiac Cath - 2002  Sleep Study - denies CPAP -   Fasting Blood Sugar - na Checks Blood Sugar _____ times a day  Blood Thinner Instructions:na Aspirin Instructions:pt states he was instructed by Dr. Arnoldo Morale to stop his aspirin on 01/12/22.  ERAS Protcol -no PRE-SURGERY Ensure or G2-   COVID TEST- n/a   Anesthesia review: yes for cardiac history   Patient denies shortness of breath, fever, cough and chest pain at PAT appointment   All instructions explained to the patient, with a verbal understanding of the material. Patient agrees to go over the instructions while at home for a better understanding. Patient also instructed to wear a mask when out in public prior to surgery. The opportunity to ask questions was provided.

## 2022-01-10 NOTE — Progress Notes (Signed)
Anesthesia Chart Review:  Follows with cardiologist Dr. Nehemiah Massed [facility] for hx of CAD s/p remote CABG, HTN, HLD. Last seen 09/02/21 and noted to have recent low risk nuclear stress and echo showing EF 55-60% with mild-mod MR. Pt was cleared for surgery at that time. Per note, "Proceed to surgery and/or invasive procedure without restriction to pre or post operative and/or procedural care. The patient is at lowest risk possible for cardiovascular complications with surgical intervention and/or invasive procedure. Currently has no evidence active and/or significant angina and/or congestive heart failure. The patient may discontinue aspirin 7 days prior to procedure and restart at a safe period thereafter"  History of large hiatal hernia by CT 06/19/2019.  History of prostate cancer s/p prostatectomy/XRT.  Follows with nephrology at [facility] for history of CKD 3.  Preop labs reviewed, creatinine mildly elevated 1.34, mild anemia with hemoglobin 12.4, platelets very mildly low at 146k, otherwise unremarkable.  EKG 08/04/2021 (Care Everywhere): Sinus bradycardia.  Rate 54.  Carotid duplex 07/30/2021: Summary:  Right Carotid: Velocities in the right ICA are consistent with a high-end 1-39% stenosis.   Left Carotid: Velocities in the left ICA are consistent with a 1-39% stenosis.   Left carotid artery velocities appear less than previously recorded when compared to the exam on 07/31/20 with the right carotid velocities remaining stable.   Stress test 08/30/21 (care everywhere): Normal treadmill EKG without evidence of ischemia or arrhythmia  Normal myocardial perfusion without evidence of myocardial ischemia   TTE 08/29/21 (care everywhere): NORMAL LEFT VENTRICULAR SYSTOLIC FUNCTION  NORMAL RIGHT VENTRICULAR SYSTOLIC FUNCTION  NO VALVULAR STENOSIS  MILD to MODERATE MR  MILD TR, PR  EF 55-60%      Nolyn, Swab [facility] Short Stay Center/Anesthesiology Phone [phone] 01/11/2022 11:17  AM

## 2022-01-11 NOTE — Anesthesia Preprocedure Evaluation (Addendum)
Anesthesia Evaluation  Patient identified by MRN, date of birth, ID band Patient awake    Reviewed: Allergy & Precautions, NPO status , Patient's Chart, lab work & pertinent test results  History of Anesthesia Complications (+) PONV  Airway Mallampati: I  TM Distance: >3 FB Neck ROM: Full    Dental  (+) Dental Advisory Given, Teeth Intact   Pulmonary neg pulmonary ROS,    breath sounds clear to auscultation       Cardiovascular hypertension, Pt. on medications and Pt. on home beta blockers (-) angina+ CAD and + CABG   Rhythm:Regular Rate:Normal  08/2021 ECHO: EF 55-60%, mild-mod MR 08/2021 Stress: low risk   Neuro/Psych Chronic back pain    GI/Hepatic Neg liver ROS, hiatal hernia, GERD  Controlled,  Endo/Other  Hypothyroidism   Renal/GU Renal InsufficiencyRenal disease (creat 1.34)   H/o prostate cancer    Musculoskeletal  (+) Arthritis ,   Abdominal   Peds  Hematology negative hematology ROS (+)   Anesthesia Other Findings   Reproductive/Obstetrics                           Anesthesia Physical Anesthesia Plan  ASA: 3  Anesthesia Plan: General   Post-op Pain Management: Tylenol PO (pre-op)*   Induction: Intravenous  PONV Risk Score and Plan: 2 and Ondansetron and Dexamethasone  Airway Management Planned: Oral ETT  Additional Equipment: None  Intra-op Plan:   Post-operative Plan: Extubation in OR  Informed Consent: I have reviewed the patients History and Physical, chart, labs and discussed the procedure including the risks, benefits and alternatives for the proposed anesthesia with the patient or authorized representative who has indicated his/her understanding and acceptance.     Dental advisory given  Plan Discussed with: CRNA and Surgeon  Anesthesia Plan Comments: (PAT note by Karoline Caldwell, PA-C:  Follows with cardiologist Dr. Rollene F[facility] clinic for hx of  CAD s/p remote CABG, HTN, HLD. Last seen 09/02/21 and noted to have recent low risk nuclear stress and echo showing EF 55-60% with mild-mod MR. Pt was cleared for surgery at that time. Per note, "Proceed to surgery and/or invasive procedure without restriction to pre or post operative and/or procedural care. The patient is at lowest risk possible for cardiovascular complications with surgical intervention and/or invasive procedure. Currently has no evidence active and/or significant angina and/or congestive heart failure. The patient may discontinue aspirin 7 days prior to procedure and restart at a safe period thereafter"  History of large hiatal hernia by CT 06/19/2019.  History of prostate cancer s/p prostatectomy/XRT.  Follows with nephrology at [facility] for history of CKD 3.  Preop labs reviewed, creatinine mildly elevated 1.34, mild anemia with hemoglobin 12.4, platelets very mildly low at 146k, otherwise unremarkable.  EKG 08/04/2021 (Care Everywhere): Sinus bradycardia.  Rate 54.  Carotid duplex 07/30/2021: Summary:  Right Carotid: Velocities in the right ICA are consistent with a high-end 1-39% stenosis.   Left Carotid: Velocities in the left ICA are consistent with a 1-39% stenosis.   Left carotid artery velocities appear less than previously recorded when compared to the exam on 07/31/20 with the right carotid velocities remaining stable.   Stress test 08/30/21 (care everywhere): Normal treadmill EKG without evidence of ischemia or arrhythmia  Normal myocardial perfusion without evidence of myocardial ischemia   TTE 08/29/21 (care everywhere): NORMAL LEFT VENTRICULAR SYSTOLIC FUNCTION  NORMAL RIGHT VENTRICULAR SYSTOLIC FUNCTION  NO VALVULAR STENOSIS  MILD to MODERATE MR  MILD TR, PR  EF 55-60%   )      Anesthesia Quick Evaluation

## 2022-01-17 ENCOUNTER — Ambulatory Visit (HOSPITAL_BASED_OUTPATIENT_CLINIC_OR_DEPARTMENT_OTHER): Payer: Medicare PPO | Admitting: Anesthesiology

## 2022-01-17 ENCOUNTER — Encounter (HOSPITAL_COMMUNITY): Payer: Self-pay | Admitting: Neurosurgery

## 2022-01-17 ENCOUNTER — Ambulatory Visit (HOSPITAL_COMMUNITY)
Admission: RE | Admit: 2022-01-17 | Discharge: 2022-01-18 | Disposition: A | Discharge status: Home / Self Care | Payer: Medicare PPO | Attending: Neurosurgery | Admitting: Neurosurgery

## 2022-01-17 ENCOUNTER — Other Ambulatory Visit: Payer: Self-pay

## 2022-01-17 ENCOUNTER — Ambulatory Visit (HOSPITAL_COMMUNITY): Payer: Medicare PPO

## 2022-01-17 ENCOUNTER — Ambulatory Visit (HOSPITAL_COMMUNITY)
Admission: RE | Disposition: A | Discharge status: Home / Self Care | Payer: Self-pay | Source: Home / Self Care | Attending: Neurosurgery

## 2022-01-17 ENCOUNTER — Ambulatory Visit (HOSPITAL_COMMUNITY): Payer: Medicare PPO | Admitting: Physician Assistant

## 2022-01-17 DIAGNOSIS — M5116 Intervertebral disc disorders with radiculopathy, lumbar region: Secondary | ICD-10-CM | POA: Insufficient documentation

## 2022-01-17 DIAGNOSIS — M4726 Other spondylosis with radiculopathy, lumbar region: Secondary | ICD-10-CM

## 2022-01-17 DIAGNOSIS — I129 Hypertensive chronic kidney disease with stage 1 through stage 4 chronic kidney disease, or unspecified chronic kidney disease: Secondary | ICD-10-CM | POA: Diagnosis not present

## 2022-01-17 DIAGNOSIS — K449 Diaphragmatic hernia without obstruction or gangrene: Secondary | ICD-10-CM | POA: Insufficient documentation

## 2022-01-17 DIAGNOSIS — E039 Hypothyroidism, unspecified: Secondary | ICD-10-CM | POA: Diagnosis not present

## 2022-01-17 DIAGNOSIS — M1990 Unspecified osteoarthritis, unspecified site: Secondary | ICD-10-CM | POA: Insufficient documentation

## 2022-01-17 DIAGNOSIS — I2510 Atherosclerotic heart disease of native coronary artery without angina pectoris: Secondary | ICD-10-CM | POA: Insufficient documentation

## 2022-01-17 DIAGNOSIS — N183 Chronic kidney disease, stage 3 unspecified: Secondary | ICD-10-CM | POA: Diagnosis not present

## 2022-01-17 DIAGNOSIS — K219 Gastro-esophageal reflux disease without esophagitis: Secondary | ICD-10-CM | POA: Diagnosis not present

## 2022-01-17 DIAGNOSIS — Z951 Presence of aortocoronary bypass graft: Secondary | ICD-10-CM | POA: Diagnosis not present

## 2022-01-17 DIAGNOSIS — M48062 Spinal stenosis, lumbar region with neurogenic claudication: Secondary | ICD-10-CM

## 2022-01-17 LAB — ABO/RH: ABO/RH(D): O POS

## 2022-01-17 SURGERY — POSTERIOR LUMBAR FUSION 1 LEVEL
Anesthesia: General

## 2022-01-17 MED ORDER — METOPROLOL SUCCINATE ER 50 MG PO TB24
50.0000 mg | ORAL_TABLET | Freq: Two times a day (BID) | ORAL | Status: DC
  Administered 2022-01-17: 50 mg via ORAL
  Filled 2022-01-17: qty 1

## 2022-01-17 MED ORDER — ACETAMINOPHEN 500 MG PO TABS: 1000.0000 mg | ORAL_TABLET | Freq: Once | ORAL | Status: AC

## 2022-01-17 MED ORDER — PROPOFOL 10 MG/ML IV BOLUS
INTRAVENOUS | Status: DC | PRN
  Administered 2022-01-17: 80 mg via INTRAVENOUS

## 2022-01-17 MED ORDER — MIDAZOLAM HCL 2 MG/2ML IJ SOLN: 0.5000 mg | Freq: Once | INTRAMUSCULAR | Status: DC | PRN

## 2022-01-17 MED ORDER — ACETAMINOPHEN 500 MG PO TABS
ORAL_TABLET | ORAL | Status: AC
  Administered 2022-01-17: 1000 mg via ORAL
  Filled 2022-01-17: qty 2

## 2022-01-17 MED ORDER — POLYETHYLENE GLYCOL 3350 17 G PO PACK: 17.0000 g | PACK | Freq: Every day | ORAL | Status: DC | PRN

## 2022-01-17 MED ORDER — ORAL CARE MOUTH RINSE: 15.0000 mL | Freq: Once | OROMUCOSAL | Status: AC

## 2022-01-17 MED ORDER — THROMBIN 5000 UNITS EX SOLR
CUTANEOUS | Status: AC
  Filled 2022-01-17: qty 5000

## 2022-01-17 MED ORDER — ACETAMINOPHEN 650 MG RE SUPP: 650.0000 mg | RECTAL | Status: DC | PRN

## 2022-01-17 MED ORDER — PROMETHAZINE HCL 25 MG/ML IJ SOLN
6.2500 mg | INTRAMUSCULAR | Status: DC | PRN
  Administered 2022-01-17: 6.25 mg via INTRAVENOUS

## 2022-01-17 MED ORDER — HYDROMORPHONE HCL 1 MG/ML IJ SOLN
0.2500 mg | INTRAMUSCULAR | Status: DC | PRN
  Administered 2022-01-17: 0.5 mg via INTRAVENOUS

## 2022-01-17 MED ORDER — TRAMADOL HCL 50 MG PO TABS
50.0000 mg | ORAL_TABLET | Freq: Four times a day (QID) | ORAL | Status: DC | PRN
  Administered 2022-01-17: 50 mg via ORAL
  Administered 2022-01-17: 100 mg via ORAL
  Administered 2022-01-18: 50 mg via ORAL
  Filled 2022-01-17 (×2): qty 1
  Filled 2022-01-17: qty 2

## 2022-01-17 MED ORDER — DEXAMETHASONE SODIUM PHOSPHATE 10 MG/ML IJ SOLN
INTRAMUSCULAR | Status: AC
  Filled 2022-01-17: qty 1

## 2022-01-17 MED ORDER — ROCURONIUM BROMIDE 10 MG/ML (PF) SYRINGE
PREFILLED_SYRINGE | INTRAVENOUS | Status: AC
Start: 2022-01-17 — End: ?
  Filled 2022-01-17: qty 10

## 2022-01-17 MED ORDER — CEFAZOLIN SODIUM-DEXTROSE 2-4 GM/100ML-% IV SOLN
INTRAVENOUS | Status: AC
  Filled 2022-01-17: qty 100

## 2022-01-17 MED ORDER — FENTANYL CITRATE (PF) 250 MCG/5ML IJ SOLN
INTRAMUSCULAR | Status: AC
  Filled 2022-01-17: qty 5

## 2022-01-17 MED ORDER — BUPIVACAINE-EPINEPHRINE (PF) 0.5% -1:200000 IJ SOLN
INTRAMUSCULAR | Status: DC | PRN
  Administered 2022-01-17: 10 mL via PERINEURAL

## 2022-01-17 MED ORDER — BACITRACIN ZINC 500 UNIT/GM EX OINT
TOPICAL_OINTMENT | CUTANEOUS | Status: AC
Start: 2022-01-17 — End: ?
  Filled 2022-01-17: qty 28.35

## 2022-01-17 MED ORDER — ONDANSETRON HCL 4 MG/2ML IJ SOLN: 4.0000 mg | Freq: Four times a day (QID) | INTRAMUSCULAR | Status: DC | PRN

## 2022-01-17 MED ORDER — PROMETHAZINE HCL 25 MG/ML IJ SOLN
INTRAMUSCULAR | Status: AC
  Filled 2022-01-17: qty 1

## 2022-01-17 MED ORDER — CEFAZOLIN SODIUM-DEXTROSE 2-4 GM/100ML-% IV SOLN
2.0000 g | Freq: Three times a day (TID) | INTRAVENOUS | Status: AC
  Administered 2022-01-17 (×2): 2 g via INTRAVENOUS
  Filled 2022-01-17: qty 100

## 2022-01-17 MED ORDER — BACITRACIN ZINC 500 UNIT/GM EX OINT
TOPICAL_OINTMENT | CUTANEOUS | Status: DC | PRN
  Administered 2022-01-17: 1 via TOPICAL

## 2022-01-17 MED ORDER — FENTANYL CITRATE (PF) 250 MCG/5ML IJ SOLN
INTRAMUSCULAR | Status: DC | PRN
Start: 2022-01-17 — End: 2022-01-17
  Administered 2022-01-17: 250 ug via INTRAVENOUS
  Administered 2022-01-17: 50 ug via INTRAVENOUS

## 2022-01-17 MED ORDER — HYDROMORPHONE HCL 1 MG/ML IJ SOLN
INTRAMUSCULAR | Status: AC
  Filled 2022-01-17: qty 1

## 2022-01-17 MED ORDER — ROCURONIUM BROMIDE 10 MG/ML (PF) SYRINGE
PREFILLED_SYRINGE | INTRAVENOUS | Status: DC | PRN
  Administered 2022-01-17 (×2): 10 mg via INTRAVENOUS
  Administered 2022-01-17: 70 mg via INTRAVENOUS

## 2022-01-17 MED ORDER — CEFAZOLIN SODIUM-DEXTROSE 2-4 GM/100ML-% IV SOLN
2.0000 g | INTRAVENOUS | Status: AC
  Administered 2022-01-17: 2 g via INTRAVENOUS

## 2022-01-17 MED ORDER — CHLORHEXIDINE GLUCONATE 0.12 % MT SOLN
15.0000 mL | Freq: Once | OROMUCOSAL | Status: AC
  Administered 2022-01-17: 15 mL via OROMUCOSAL
  Filled 2022-01-17: qty 15

## 2022-01-17 MED ORDER — OXYCODONE HCL 5 MG PO TABS: 5.0000 mg | ORAL_TABLET | ORAL | Status: DC | PRN

## 2022-01-17 MED ORDER — DOXAZOSIN MESYLATE 4 MG PO TABS
4.0000 mg | ORAL_TABLET | Freq: Every day | ORAL | Status: DC
  Administered 2022-01-17: 4 mg via ORAL
  Filled 2022-01-17 (×2): qty 1

## 2022-01-17 MED ORDER — TRAMADOL HCL 50 MG PO TABS: 50.0000 mg | ORAL_TABLET | Freq: Four times a day (QID) | ORAL | Status: DC | PRN

## 2022-01-17 MED ORDER — ONDANSETRON HCL 4 MG/2ML IJ SOLN
INTRAMUSCULAR | Status: DC | PRN
  Administered 2022-01-17: 4 mg via INTRAVENOUS

## 2022-01-17 MED ORDER — ONDANSETRON HCL 4 MG/2ML IJ SOLN
INTRAMUSCULAR | Status: AC
  Filled 2022-01-17: qty 2

## 2022-01-17 MED ORDER — OXYCODONE HCL 5 MG PO TABS: 5.0000 mg | ORAL_TABLET | Freq: Once | ORAL | Status: DC | PRN

## 2022-01-17 MED ORDER — PROPOFOL 10 MG/ML IV BOLUS
INTRAVENOUS | Status: AC
  Filled 2022-01-17: qty 20

## 2022-01-17 MED ORDER — PHENYLEPHRINE 80 MCG/ML (10ML) SYRINGE FOR IV PUSH (FOR BLOOD PRESSURE SUPPORT)
PREFILLED_SYRINGE | INTRAVENOUS | Status: AC
  Filled 2022-01-17: qty 10

## 2022-01-17 MED ORDER — ONDANSETRON HCL 4 MG PO TABS: 4.0000 mg | ORAL_TABLET | Freq: Four times a day (QID) | ORAL | Status: DC | PRN

## 2022-01-17 MED ORDER — PHENYLEPHRINE 80 MCG/ML (10ML) SYRINGE FOR IV PUSH (FOR BLOOD PRESSURE SUPPORT)
PREFILLED_SYRINGE | INTRAVENOUS | Status: DC | PRN
  Administered 2022-01-17: 80 ug via INTRAVENOUS

## 2022-01-17 MED ORDER — PHENYLEPHRINE HCL-NACL 20-0.9 MG/250ML-% IV SOLN
INTRAVENOUS | Status: DC | PRN
  Administered 2022-01-17: 25 ug/min via INTRAVENOUS

## 2022-01-17 MED ORDER — LEVOTHYROXINE SODIUM 100 MCG PO TABS
100.0000 ug | ORAL_TABLET | Freq: Every day | ORAL | Status: DC
  Administered 2022-01-18: 100 ug via ORAL
  Filled 2022-01-17: qty 1

## 2022-01-17 MED ORDER — 0.9 % SODIUM CHLORIDE (POUR BTL) OPTIME
TOPICAL | Status: DC | PRN
  Administered 2022-01-17: 1000 mL

## 2022-01-17 MED ORDER — BUPIVACAINE LIPOSOME 1.3 % IJ SUSP
INTRAMUSCULAR | Status: DC | PRN
  Administered 2022-01-17: 20 mL

## 2022-01-17 MED ORDER — DOCUSATE SODIUM 100 MG PO CAPS
100.0000 mg | ORAL_CAPSULE | Freq: Two times a day (BID) | ORAL | Status: DC
  Administered 2022-01-17: 100 mg via ORAL
  Filled 2022-01-17: qty 1

## 2022-01-17 MED ORDER — LOSARTAN POTASSIUM 50 MG PO TABS: 100.0000 mg | ORAL_TABLET | Freq: Every day | ORAL | Status: DC

## 2022-01-17 MED ORDER — PHENOL 1.4 % MT LIQD
1.0000 | OROMUCOSAL | Status: DC | PRN
Start: 2022-01-17 — End: 2022-01-18

## 2022-01-17 MED ORDER — CYCLOBENZAPRINE HCL 10 MG PO TABS: 10.0000 mg | ORAL_TABLET | Freq: Three times a day (TID) | ORAL | Status: DC | PRN

## 2022-01-17 MED ORDER — THROMBIN 5000 UNITS EX SOLR
OROMUCOSAL | Status: DC | PRN
  Administered 2022-01-17 (×2): 5 mL via TOPICAL

## 2022-01-17 MED ORDER — MORPHINE SULFATE (PF) 4 MG/ML IV SOLN: 4.0000 mg | INTRAVENOUS | Status: DC | PRN

## 2022-01-17 MED ORDER — SUGAMMADEX SODIUM 200 MG/2ML IV SOLN
INTRAVENOUS | Status: DC | PRN
  Administered 2022-01-17: 150 mg via INTRAVENOUS

## 2022-01-17 MED ORDER — MAGNESIUM OXIDE -MG SUPPLEMENT 400 (240 MG) MG PO TABS: 400.0000 mg | ORAL_TABLET | Freq: Every day | ORAL | Status: DC

## 2022-01-17 MED ORDER — LIDOCAINE 2% (20 MG/ML) 5 ML SYRINGE
INTRAMUSCULAR | Status: AC
  Filled 2022-01-17: qty 5

## 2022-01-17 MED ORDER — LACTATED RINGERS IV SOLN: INTRAVENOUS | Status: DC

## 2022-01-17 MED ORDER — GLYCOPYRROLATE 0.2 MG/ML IJ SOLN
INTRAMUSCULAR | Status: DC | PRN
  Administered 2022-01-17: .2 mg via INTRAVENOUS

## 2022-01-17 MED ORDER — OXYCODONE HCL 5 MG/5ML PO SOLN: 5.0000 mg | Freq: Once | ORAL | Status: DC | PRN

## 2022-01-17 MED ORDER — BUPIVACAINE LIPOSOME 1.3 % IJ SUSP
INTRAMUSCULAR | Status: AC
Start: 2022-01-17 — End: ?
  Filled 2022-01-17: qty 20

## 2022-01-17 MED ORDER — SODIUM CHLORIDE 0.9 % IV SOLN
250.0000 mL | INTRAVENOUS | Status: DC
  Administered 2022-01-17: 250 mL via INTRAVENOUS

## 2022-01-17 MED ORDER — OXYCODONE HCL 5 MG PO TABS: 10.0000 mg | ORAL_TABLET | ORAL | Status: DC | PRN

## 2022-01-17 MED ORDER — EPHEDRINE SULFATE-NACL 50-0.9 MG/10ML-% IV SOSY
PREFILLED_SYRINGE | INTRAVENOUS | Status: DC | PRN
  Administered 2022-01-17: 5 mg via INTRAVENOUS
  Administered 2022-01-17: 10 mg via INTRAVENOUS
  Administered 2022-01-17 (×2): 5 mg via INTRAVENOUS

## 2022-01-17 MED ORDER — CHLORHEXIDINE GLUCONATE CLOTH 2 % EX PADS: 6.0000 | MEDICATED_PAD | Freq: Once | CUTANEOUS | Status: DC

## 2022-01-17 MED ORDER — LORATADINE 10 MG PO TABS: 5.0000 mg | ORAL_TABLET | Freq: Every day | ORAL | Status: DC

## 2022-01-17 MED ORDER — ACETAMINOPHEN 325 MG PO TABS: 650.0000 mg | ORAL_TABLET | ORAL | Status: DC | PRN

## 2022-01-17 MED ORDER — LIDOCAINE 2% (20 MG/ML) 5 ML SYRINGE
INTRAMUSCULAR | Status: DC | PRN
  Administered 2022-01-17: 30 mg via INTRAVENOUS

## 2022-01-17 MED ORDER — BISACODYL 10 MG RE SUPP: 10.0000 mg | Freq: Every day | RECTAL | Status: DC | PRN

## 2022-01-17 MED ORDER — ACETAMINOPHEN 500 MG PO TABS
1000.0000 mg | ORAL_TABLET | Freq: Four times a day (QID) | ORAL | Status: AC
  Administered 2022-01-17 – 2022-01-18 (×4): 1000 mg via ORAL
  Filled 2022-01-17 (×4): qty 2

## 2022-01-17 MED ORDER — SODIUM CHLORIDE 0.9% FLUSH: 3.0000 mL | INTRAVENOUS | Status: DC | PRN

## 2022-01-17 MED ORDER — PANTOPRAZOLE SODIUM 40 MG PO TBEC: 80.0000 mg | DELAYED_RELEASE_TABLET | Freq: Every day | ORAL | Status: DC

## 2022-01-17 MED ORDER — EPHEDRINE 5 MG/ML INJ
INTRAVENOUS | Status: AC
  Filled 2022-01-17: qty 5

## 2022-01-17 MED ORDER — DEXAMETHASONE SODIUM PHOSPHATE 10 MG/ML IJ SOLN
INTRAMUSCULAR | Status: DC | PRN
  Administered 2022-01-17: 10 mg via INTRAVENOUS

## 2022-01-17 MED ORDER — SODIUM CHLORIDE 0.9% FLUSH
3.0000 mL | Freq: Two times a day (BID) | INTRAVENOUS | Status: DC
  Administered 2022-01-17 (×2): 3 mL via INTRAVENOUS

## 2022-01-17 MED ORDER — AMLODIPINE BESYLATE 5 MG PO TABS: 5.0000 mg | ORAL_TABLET | Freq: Two times a day (BID) | ORAL | Status: DC

## 2022-01-17 MED ORDER — MENTHOL 3 MG MT LOZG: 1.0000 | LOZENGE | OROMUCOSAL | Status: DC | PRN

## 2022-01-17 SURGICAL SUPPLY — 67 items
BAG COUNTER SPONGE SURGICOUNT (BAG) ×1 IMPLANT
BASKET BONE COLLECTION (BASKET) ×1 IMPLANT
BENZOIN TINCTURE PRP APPL 2/3 (GAUZE/BANDAGES/DRESSINGS) ×1 IMPLANT
BLADE CLIPPER SURG (BLADE) IMPLANT
BUR MATCHSTICK NEURO 3.0 LAGG (BURR) ×1 IMPLANT
BUR PRECISION FLUTE 6.0 (BURR) ×1 IMPLANT
CANISTER SUCT 3000ML PPV (MISCELLANEOUS) ×1 IMPLANT
CAP LOCK DLX THRD (Cap) IMPLANT
CARTRIDGE OIL MAESTRO DRILL (MISCELLANEOUS) ×1 IMPLANT
CNTNR URN SCR LID CUP LEK RST (MISCELLANEOUS) ×1 IMPLANT
CONT SPEC 4OZ STRL OR WHT (MISCELLANEOUS) ×1
COVER BACK TABLE 60X90IN (DRAPES) ×1 IMPLANT
DIFFUSER DRILL AIR PNEUMATIC (MISCELLANEOUS) ×1 IMPLANT
DRAPE C-ARM 42X72 X-RAY (DRAPES) ×2 IMPLANT
DRAPE HALF SHEET 40X57 (DRAPES) ×1 IMPLANT
DRAPE LAPAROTOMY 100X72X124 (DRAPES) ×1 IMPLANT
DRAPE SURG 17X23 STRL (DRAPES) ×4 IMPLANT
DRSG OPSITE POSTOP 4X6 (GAUZE/BANDAGES/DRESSINGS) ×1 IMPLANT
ELECT BLADE 4.0 EZ CLEAN MEGAD (MISCELLANEOUS) ×1
ELECT REM PT RETURN 9FT ADLT (ELECTROSURGICAL) ×1
ELECTRODE BLDE 4.0 EZ CLN MEGD (MISCELLANEOUS) ×1 IMPLANT
ELECTRODE REM PT RTRN 9FT ADLT (ELECTROSURGICAL) ×1 IMPLANT
EVACUATOR 1/8 PVC DRAIN (DRAIN) IMPLANT
GAUZE 4X4 16PLY ~~LOC~~+RFID DBL (SPONGE) ×1 IMPLANT
GLOVE BIO SURGEON STRL SZ 6 (GLOVE) ×1 IMPLANT
GLOVE BIO SURGEON STRL SZ8 (GLOVE) ×2 IMPLANT
GLOVE BIO SURGEON STRL SZ8.5 (GLOVE) ×2 IMPLANT
GLOVE BIOGEL PI IND STRL 6.5 (GLOVE) ×1 IMPLANT
GLOVE BIOGEL PI IND STRL 7.5 (GLOVE) IMPLANT
GLOVE BIOGEL PI INDICATOR 6.5 (GLOVE) ×1
GLOVE BIOGEL PI INDICATOR 7.5 (GLOVE) ×2
GLOVE EXAM NITRILE XL STR (GLOVE) IMPLANT
GOWN STRL REUS W/ TWL LRG LVL3 (GOWN DISPOSABLE) ×1 IMPLANT
GOWN STRL REUS W/ TWL XL LVL3 (GOWN DISPOSABLE) ×2 IMPLANT
GOWN STRL REUS W/TWL 2XL LVL3 (GOWN DISPOSABLE) IMPLANT
GOWN STRL REUS W/TWL LRG LVL3 (GOWN DISPOSABLE) ×1
GOWN STRL REUS W/TWL XL LVL3 (GOWN DISPOSABLE) ×4
HEMOSTAT POWDER KIT SURGIFOAM (HEMOSTASIS) ×1 IMPLANT
KIT BASIN OR (CUSTOM PROCEDURE TRAY) ×1 IMPLANT
KIT GRAFTMAG DEL NEURO DISP (NEUROSURGERY SUPPLIES) IMPLANT
KIT TURNOVER KIT B (KITS) ×1 IMPLANT
NDL HYPO 21X1.5 SAFETY (NEEDLE) IMPLANT
NEEDLE HYPO 21X1.5 SAFETY (NEEDLE) ×1 IMPLANT
NEEDLE HYPO 22GX1.5 SAFETY (NEEDLE) ×1 IMPLANT
NS IRRIG 1000ML POUR BTL (IV SOLUTION) ×1 IMPLANT
OIL CARTRIDGE MAESTRO DRILL (MISCELLANEOUS) ×1
PACK LAMINECTOMY NEURO (CUSTOM PROCEDURE TRAY) ×1 IMPLANT
PAD ARMBOARD 7.5X6 YLW CONV (MISCELLANEOUS) ×3 IMPLANT
PATTIES SURGICAL .5 X1 (DISPOSABLE) IMPLANT
PUTTY DBM 5CC CALC GRAN (Putty) IMPLANT
ROD CREO DLX CVD 6.35X40 (Rod) IMPLANT
ROD CURVED TI 6.35X40 (Rod) ×2 IMPLANT
SCREW PA DLX CREO 7.5X50 (Screw) IMPLANT
SPACER ALTERA 10X31 8-12MM-8 (Spacer) IMPLANT
SPIKE FLUID TRANSFER (MISCELLANEOUS) ×1 IMPLANT
SPONGE NEURO XRAY DETECT 1X3 (DISPOSABLE) IMPLANT
SPONGE SURGIFOAM ABS GEL 100 (HEMOSTASIS) IMPLANT
SPONGE T-LAP 4X18 ~~LOC~~+RFID (SPONGE) IMPLANT
STRIP CLOSURE SKIN 1/2X4 (GAUZE/BANDAGES/DRESSINGS) ×1 IMPLANT
SUT VIC AB 1 CT1 18XBRD ANBCTR (SUTURE) ×2 IMPLANT
SUT VIC AB 1 CT1 8-18 (SUTURE) ×2
SUT VIC AB 2-0 CP2 18 (SUTURE) ×2 IMPLANT
SYR 20ML LL LF (SYRINGE) IMPLANT
TOWEL GREEN STERILE (TOWEL DISPOSABLE) ×1 IMPLANT
TOWEL GREEN STERILE FF (TOWEL DISPOSABLE) ×1 IMPLANT
TRAY FOLEY MTR SLVR 16FR STAT (SET/KITS/TRAYS/PACK) ×1 IMPLANT
WATER STERILE IRR 1000ML POUR (IV SOLUTION) ×1 IMPLANT

## 2022-01-17 NOTE — Anesthesia Procedure Notes (Signed)
Procedure Name: Intubation Date/Time: 01/17/2022 7:35 AM  Performed by: Gaylene Brooks, CRNAPre-anesthesia Checklist: Patient identified, Emergency Drugs available, Suction available and Patient being monitored Patient Re-evaluated:Patient Re-evaluated prior to induction Oxygen Delivery Method: Circle System Utilized Preoxygenation: Pre-oxygenation with 100% oxygen Induction Type: IV induction Ventilation: Mask ventilation without difficulty and Oral airway inserted - appropriate to patient size Laryngoscope Size: Sabra Heck and 2 Grade View: Grade II Tube type: Oral Tube size: 7.5 mm Number of attempts: 1 Airway Equipment and Method: Stylet and Oral airway Placement Confirmation: ETT inserted through vocal cords under direct vision, positive ETCO2 and breath sounds checked- equal and bilateral Secured at: 22 cm Tube secured with: Tape Dental Injury: Teeth and Oropharynx as per pre-operative assessment

## 2022-01-17 NOTE — Progress Notes (Signed)
Orthopedic Tech Progress Note Patient Details:  Douglas Phelps 1940/10/14 [id number]  Saw patient in hallway and he stated "he had brace"  Patient ID: Douglas Phelps, male   DOB: [birth year], 81 y.o.   MRN: [id number]  [name] 01/17/2022, 1:45 PM

## 2022-01-17 NOTE — Transfer of Care (Signed)
Immediate Anesthesia Transfer of Care Note  Patient: JOVIAN LEMBCKE  Procedure(s) Performed: POSTERIOR LUMBAR INTERBODY FUSION  INTERBODY PROTHESIS , POSTERIOR INSTRUMENTATION, LUMBAR FOUR-FIVE  Patient Location: PACU  Anesthesia Type:General  Level of Consciousness: awake, drowsy and patient cooperative  Airway & Oxygen Therapy: Patient Spontanous Breathing and Patient connected to nasal cannula oxygen  Post-op Assessment: Report given to RN, Post -op Vital signs reviewed and stable and Patient moving all extremities X 4  Post vital signs: Reviewed and stable  Last Vitals:  Vitals Value Taken Time  BP 162/65 01/17/22 1123  Temp    Pulse 65 01/17/22 1124  Resp 13 01/17/22 1124  SpO2 99 % 01/17/22 1124  Vitals shown include unvalidated device data.  Last Pain:  Vitals:   01/17/22 0603  TempSrc: Oral  PainSc: 5       Patients Stated Pain Goal: 3 (70/96/43 8381)  Complications: No notable events documented.

## 2022-01-17 NOTE — Op Note (Signed)
Brief history: The patient is an 81 year old white male who is complained of back and bilateral leg pain consistent with neurogenic claudication.  He has failed medical management and was worked up with a lumbar MRI lumbar x-rays and lumbar myelo CT.  This demonstrated L4-5 disc degeneration, stenosis, facet arthropathy, etc.  I discussed the various treatment options with him.  He has decided proceed with surgery.  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 Etc. requiring a wide decompression of the nerve roots.);  Right L4-5 transforaminal lumbar interbody fusion with local morselized autograft bone and Zimmer DBM; insertion of interbody prosthesis at L4-5 (globus peek expandable interbody prosthesis); posterior nonsegmental instrumentation from L4 to L5 with globus titanium pedicle screws and rods; posterior lateral arthrodesis at L4-5 with local morselized autograft bone and Zimmer DBM.  Surgeon: Dr. Earle Gell  Asst.: Arnetha Massy, NP  Anesthesia: Gen. endotracheal  Estimated blood loss: 200 cc  Drains: None  Complications: None  Description of procedure: The patient was brought to the operating room by the anesthesia team. General endotracheal anesthesia was induced. The patient was turned to the prone position on the Wilson frame. The patient's lumbosacral region was then prepared with Betadine scrub and Betadine solution. Sterile drapes were applied.  I then injected the area to be incised with Marcaine with  epinephrine solution. I then used the scalpel to make a linear midline incision over the L4-5 interspace. I then used electrocautery to perform a bilateral subperiosteal dissection exposing the spinous process and lamina of L4 and L5. We then obtained intraoperative radiograph to confirm our location. We then inserted the Verstrac retractor to provide exposure.  I began the decompression by using the high speed drill to perform laminotomies at L4-5 bilaterally and. We then used the Kerrison punches to widen the laminotomy and removed the ligamentum flavum at 4 5 bilaterally. We used the Kerrison punches to remove the medial facets at 4 5 bilaterally, we removed the right L4-5 facet. We performed wide foraminotomies about the bilateral L4 and L5 nerve roots completing the decompression.  We now turned our attention to the posterior lumbar interbody fusion. I used a scalpel to incise the intervertebral disc at L4-5 bilaterally. I then performed a partial intervertebral discectomy at L4-5 bilaterally using the pituitary forceps. We prepared the vertebral endplates at Q2-2 bilaterally for the fusion by removing the soft tissues with the curettes. We then used the trial spacers to pick the appropriate sized interbody prosthesis. We prefilled his prosthesis with a combination of local morselized autograft bone that we obtained during the decompression as well as Zimmer DBM. We inserted the prefilled prosthesis into the interspace at L4-5 from the right, we then turned and expanded the prosthesis. There was a good snug fit of the prosthesis in the interspace. We then filled and the remainder of the intervertebral disc space with local morselized autograft bone and Zimmer DBM. This completed the posterior lumbar interbody arthrodesis.  During the decompression and insertion of the prosthesis the assistant protected the thecal sac and nerve roots with the D'Errico retractor.  We now turned attention to the  instrumentation.  Under fluoroscopic guidance we cannulated the bilateral L4 and L5 pedicles with the bone probe. We then removed the bone probe. We then tapped the pedicle with a 25 millimeter tap. We then removed the tap. We probed inside the tapped pedicle with a ball probe to rule out cortical breaches. We then inserted a 7.5 x 50 millimeter pedicle screw into the L4 and L5 pedicles bilaterally under fluoroscopic guidance. We then palpated along the medial aspect of the pedicles to rule out cortical breaches. There were none. The nerve roots were not injured. We then connected the unilateral pedicle screws with a lordotic rod. We compressed the construct and secured the rod in place with the caps. We then tightened the caps appropriately. This completed the instrumentation from L4-5 bilaterally.  We now turned our attention to the posterior lateral arthrodesis at L4-5. We used the high-speed drill to decorticate the remainder of the facets, pars, transverse process at L4-5. We then applied a combination of local morselized autograft bone and Zimmer DBM over these decorticated posterior lateral structures. This completed the posterior lateral arthrodesis.  We then obtained hemostasis using bipolar electrocautery. We irrigated the wound out with bacitracin solution. We inspected the thecal sac and nerve roots and noted they were well decompressed. We then removed the retractor.  We injected Exparel . We reapproximated patient's thoracolumbar fascia with interrupted #1 Vicryl suture. We reapproximated patient's subcutaneous tissue with interrupted 2-0 Vicryl suture. The reapproximated patient's skin with Steri-Strips and benzoin. The wound was then coated with bacitracin ointment. A sterile dressing was applied. The drapes were removed. The patient was subsequently returned to the supine position where they were extubated by the anesthesia team. He was then transported to the post anesthesia care unit in stable  condition. All sponge instrument and needle counts were reportedly correct at the end of this case.

## 2022-01-17 NOTE — H&P (Signed)
Subjective: The patient is an 81 year old white male who has complained of back and bilateral leg pain consistent with neurogenic claudication.  He has failed medical management and was worked up with lumbar x-rays and lumbar MRI which demonstrated an L4-5 spondylolisthesis, spinal stenosis, etc.  I discussed the various treatment options with him.  He has decided proceed with surgery.  Past Medical History:  Diagnosis Date   Actinic keratosis    Arthritis    Cancer ([place]) 2015   prostate   GERD (gastroesophageal reflux disease)    High cholesterol    History of hiatal hernia    Hx of CABG    Hypertension    Hypothyroidism    PONV (postoperative nausea and vomiting)    Premature ventricular contractions    Renal disorder    CKD stg 3   SI (sacroiliac) joint dysfunction    Thyroid disease     Past Surgical History:  Procedure Laterality Date   APPENDECTOMY     CARDIAC SURGERY     CABG x 3  06/2000   CORONARY ARTERY BYPASS GRAFT     HERNIA REPAIR Right 2015   groin   PROSTATE SURGERY  10/2013    Allergies  Allergen Reactions   Benazepril Cough   Hctz [Hydrochlorothiazide] Other (See Comments)    Depleted electrolytes    Hydralazine Hcl Other (See Comments)    Kidney Disorder    Fenofibrate     Muscle soreness   Repatha [Evolocumab]     Weakness, shakiness, abnormal dreams, GI Upset   Zetia [Ezetimibe]     Creased back and leg pain   Benadryl [Diphenhydramine] Anxiety   Omeprazole Other (See Comments)    headache    Social History   Tobacco Use   Smoking status: Never   Smokeless tobacco: Never  Substance Use Topics   Alcohol use: No    History reviewed. No pertinent family history. Prior to Admission medications   Medication Sig Start Date End Date Taking? Authorizing Provider  acetaminophen (TYLENOL) 500 MG tablet Take 1,000 mg by mouth at bedtime.   Yes [provider]  amLODipine (NORVASC) 5 MG tablet Take 5 mg by mouth 2 (two) times daily.   02/15/16  Yes [provider]  Ascorbic Acid (VITAMIN C) 1000 MG tablet Take 1,000 mg by mouth daily.    Yes [provider]  aspirin EC 81 MG tablet Take 81 mg by mouth daily.    Yes [provider]  aspirin EC 81 MG tablet Take 81 mg by mouth daily.   Yes [provider]  Calcium Carbonate-Vitamin D (CALCIUM-VITAMIN D3 PO) Take 1 tablet by mouth in the morning and at bedtime.   Yes [provider]  Camphor-Menthol-Methyl Sal (SALONPAS) 3.06-04-08 % PTCH Place 1 patch onto the skin daily as needed (pain).   Yes [provider]  docusate sodium (COLACE) 100 MG capsule Take 100 mg by mouth 2 (two) times daily.   Yes [provider]  doxazosin (CARDURA) 4 MG tablet Take 4 mg by mouth daily. 02/28/19 01/17/22 Yes [provider]  esomeprazole (NEXIUM) 40 MG capsule Take 40 mg by mouth every other day.  07/08/14  Yes [provider]  famotidine (PEPCID) 20 MG tablet Take 20 mg by mouth every other day.   Yes [provider]  guaiFENesin (MUCINEX) 600 MG 12 hr tablet Take 600 mg by mouth at bedtime as needed (congestion).   Yes [provider]  levothyroxine (SYNTHROID)  100 MCG tablet Take 100 mcg by mouth daily before breakfast. 10/06/14  Yes [provider]  loratadine (CLARITIN) 5 MG chewable tablet Chew 5 mg by mouth daily.   Yes [provider]  losartan (COZAAR) 100 MG tablet Take 100 mg by mouth daily.  01/06/17 01/17/22 Yes [provider]  Magnesium Oxide 500 MG TABS Take 500 mg by mouth daily.    Yes [provider]  metoprolol succinate (TOPROL-XL) 50 MG 24 hr tablet Take 50 mg by mouth 2 (two) times daily.  06/11/12  Yes [provider]  Multiple Vitamin (MULTI-VITAMINS) TABS Take 1 tablet by mouth daily.    Yes [provider]  niacin (VITAMIN B3) 500 MG tablet Take 500 mg by mouth at bedtime.   Yes [provider]  polyethylene glycol  (MIRALAX / GLYCOLAX) 17 g packet Take 17 g by mouth daily as needed for moderate constipation.   Yes [provider]  traMADol (ULTRAM) 50 MG tablet Take 50 mg by mouth in the morning, at noon, and at bedtime.   Yes [provider]     Review of Systems  Positive ROS: As above  All other systems have been reviewed and were otherwise negative with the exception of those mentioned in the HPI and as above.  Objective: Vital signs in last 24 hours: Temp:  [97.7 F (36.5 C)] 97.7 F (36.5 C) (08/21 0603) Pulse Rate:  [59] 59 (08/21 0603) Resp:  [18] 18 (08/21 0603) BP: (164)/(63) 164/63 (08/21 0603) SpO2:  [99 %] 99 % (08/21 0603) Weight:  [74.3 kg] 74.3 kg (08/21 0603) Estimated body mass index is 26.42 kg/m as calculated from the following:   Height as of this encounter: '5\' 6"'$  (1.676 m).   Weight as of this encounter: 74.3 kg.   General Appearance: Alert Head: Normocephalic, without obvious abnormality, atraumatic Eyes: PERRL, conjunctiva/corneas clear, EOM's intact,    Ears: Normal  Throat: Normal  Neck: Supple, Back: unremarkable Lungs: Clear to auscultation bilaterally, respirations unlabored Heart: Regular rate and rhythm, no murmur, rub or gallop Abdomen: Soft, non-tender Extremities: Extremities normal, atraumatic, no cyanosis or edema Skin: unremarkable  NEUROLOGIC:   Mental status: alert and oriented,Motor Exam - grossly normal Sensory Exam - grossly normal Reflexes:  Coordination - grossly normal Gait - grossly normal Balance - grossly normal Cranial Nerves: I: smell Not tested  II: visual acuity  OS: Normal  OD: Normal   II: visual fields Full to confrontation  II: pupils Equal, round, reactive to light  III,VII: ptosis None  III,IV,VI: extraocular muscles  Full ROM  V: mastication Normal  V: facial light touch sensation  Normal  V,VII: corneal reflex  Present  VII: facial muscle function - upper  Normal  VII: facial muscle function -  lower Normal  VIII: hearing Not tested  IX: soft palate elevation  Normal  IX,X: gag reflex Present  XI: trapezius strength  5/5  XI: sternocleidomastoid strength 5/5  XI: neck flexion strength  5/5  XII: tongue strength  Normal    Data Review Lab Results  Component Value Date   WBC 4.7 01/07/2022   HGB 12.4 (L) 01/07/2022   HCT 36.4 (L) 01/07/2022   MCV 98.1 01/07/2022   PLT 146 (L) 01/07/2022   Lab Results  Component Value Date   NA 135 01/07/2022   K 5.0 01/07/2022   CL 104 01/07/2022   CO2 25 01/07/2022   BUN 22 01/07/2022   CREATININE 1.34 (H) 01/07/2022  GLUCOSE 100 (H) 01/07/2022   No results found for: "INR", "PROTIME"  Assessment/Plan: L4-5 spondylolisthesis, facet arthropathy, spinal stenosis, lumbago, lumbar radiculopathy, neurogenic claudication: I have discussed the situation with the patient.  I have reviewed his image studies with him and pointed out the abnormalities.  We have discussed the various treatment options including surgery.  I have described the surgical treatment option of an L4-5 decompression, instrumentation and fusion.  I have shown him surgical models.  We have discussed the risk, benefits, alternatives, expected postop course, and likelihood of achieving our goals with surgery.  I have answered all his questions.  He has decided proceed with surgery.   Ophelia Charter 01/17/2022 7:23 AM

## 2022-01-17 NOTE — Anesthesia Postprocedure Evaluation (Signed)
Anesthesia Post Note  Patient: SEBASTYAN SNODGRASS  Procedure(s) Performed: POSTERIOR LUMBAR INTERBODY FUSION  INTERBODY PROTHESIS , POSTERIOR INSTRUMENTATION, LUMBAR FOUR-FIVE     Patient location during evaluation: PACU Anesthesia Type: General Level of consciousness: awake and alert, patient cooperative and oriented Pain management: pain level controlled Vital Signs Assessment: post-procedure vital signs reviewed and stable Respiratory status: spontaneous breathing, nonlabored ventilation and respiratory function stable Cardiovascular status: blood pressure returned to baseline and stable : nausea improving. Anesthetic complications: no   No notable events documented.  Last Vitals:  Vitals:   01/17/22 1150 01/17/22 1205  BP: (!) 159/58 (!) 143/50  Pulse: 60 (!) 59  Resp: 16 13  Temp:    SpO2: 100% 99%    Last Pain:  Vitals:   01/17/22 1120  TempSrc:   PainSc: 0-No pain                 Lexiana Spindel,E. Nayda Riesen

## 2022-01-18 DIAGNOSIS — M4726 Other spondylosis with radiculopathy, lumbar region: Secondary | ICD-10-CM | POA: Diagnosis not present

## 2022-01-18 MED ORDER — CYCLOBENZAPRINE HCL 5 MG PO TABS: 5.0000 mg | ORAL_TABLET | Freq: Three times a day (TID) | ORAL | Status: DC | PRN

## 2022-01-18 MED ORDER — OXYCODONE-ACETAMINOPHEN 5-325 MG PO TABS: 1.0000 | ORAL_TABLET | ORAL | 0 refills | Status: DC | PRN

## 2022-01-18 MED ORDER — OXYCODONE-ACETAMINOPHEN 5-325 MG PO TABS: 1.0000 | ORAL_TABLET | ORAL | Status: DC | PRN

## 2022-01-18 MED ORDER — CYCLOBENZAPRINE HCL 5 MG PO TABS: 5.0000 mg | ORAL_TABLET | Freq: Three times a day (TID) | ORAL | 0 refills | Status: DC | PRN

## 2022-01-18 MED FILL — Thrombin For Soln 5000 Unit: CUTANEOUS | Qty: 5000 | Status: AC

## 2022-01-18 NOTE — Discharge Summary (Signed)
Physician Discharge Summary  Patient ID: Douglas Phelps MRN: [id number] DOB/AGE: Oct 18, 1940 81 y.o.  Admit date: 01/17/2022 Discharge date: 01/18/2022  Admission Diagnoses: Lumbar spinal stenosis, lumbar radiculopathy, lumbago, lumbar facet arthropathy  Discharge Diagnoses: The same Principal Problem:   Lumbar stenosis with neurogenic claudication   Discharged Condition: good  Hospital Course: I performed an L4-5 decompression, instrumentation and fusion on the patient on 01/17/2022.  The surgery went well.  The patient's postoperative course was unremarkable.  On postoperative day #1 he felt much better and requested discharge home.  The patient, and his wife, were given verbal and written discharge instructions.  All their questions were answered.  Consults: PT, OT, care management Significant Diagnostic Studies: None Treatments: L4-5 decompression, instrumentation and fusion. Discharge Exam: Blood pressure (!) 129/50, pulse (!) 59, temperature 98.6 F (37 C), temperature source Oral, resp. rate 16, height '5\' 6"'$  (1.676 m), weight 74.3 kg, SpO2 98 %. The patient is alert and pleasant.  He looks well.  His strength is normal.  Disposition: Home  Discharge Instructions     Call MD for:  difficulty breathing, headache or visual disturbances   Complete by: As directed    Call MD for:  extreme fatigue   Complete by: As directed    Call MD for:  hives   Complete by: As directed    Call MD for:  persistant dizziness or light-headedness   Complete by: As directed    Call MD for:  persistant nausea and vomiting   Complete by: As directed    Call MD for:  redness, tenderness, or signs of infection (pain, swelling, redness, odor or green/yellow discharge around incision site)   Complete by: As directed    Call MD for:  severe uncontrolled pain   Complete by: As directed    Call MD for:  temperature >100.4   Complete by: As directed    Diet - low sodium heart healthy   Complete  by: As directed    Discharge instructions   Complete by: As directed    Call [phone] for a followup appointment. Take a stool softener while you are using pain medications.   Driving Restrictions   Complete by: As directed    Do not drive for 2 weeks.   Increase activity slowly   Complete by: As directed    Lifting restrictions   Complete by: As directed    Do not lift more than 5 pounds. No excessive bending or twisting.   May shower / Bathe   Complete by: As directed    Remove the dressing for 3 days after surgery.  You may shower, but leave the incision alone.   Remove dressing in 48 hours   Complete by: As directed       Allergies as of 01/18/2022       Reactions   Benazepril Cough   Hctz [hydrochlorothiazide] Other (See Comments)   Depleted electrolytes    Hydralazine Hcl Other (See Comments)   Kidney Disorder   Fenofibrate    Muscle soreness   Repatha [evolocumab]    Weakness, shakiness, abnormal dreams, GI Upset   Zetia [ezetimibe]    Creased back and leg pain   Benadryl [diphenhydramine] Anxiety   Omeprazole Other (See Comments)   headache        Medication List     TAKE these medications    acetaminophen 500 MG tablet Commonly known as: TYLENOL Take 1,000 mg by mouth at bedtime.   amLODipine 5  MG tablet Commonly known as: NORVASC Take 5 mg by mouth 2 (two) times daily.   aspirin EC 81 MG tablet Take 81 mg by mouth daily.   aspirin EC 81 MG tablet Take 81 mg by mouth daily.   CALCIUM-VITAMIN D3 PO Take 1 tablet by mouth in the morning and at bedtime.   cyclobenzaprine 5 MG tablet Commonly known as: FLEXERIL Take 1 tablet (5 mg total) by mouth 3 (three) times daily as needed for muscle spasms.   docusate sodium 100 MG capsule Commonly known as: COLACE Take 100 mg by mouth 2 (two) times daily.   doxazosin 4 MG tablet Commonly known as: CARDURA Take 4 mg by mouth daily.   esomeprazole 40 MG capsule Commonly known as: NEXIUM Take  40 mg by mouth every other day.   famotidine 20 MG tablet Commonly known as: PEPCID Take 20 mg by mouth every other day.   guaiFENesin 600 MG 12 hr tablet Commonly known as: MUCINEX Take 600 mg by mouth at bedtime as needed (congestion).   levothyroxine 100 MCG tablet Commonly known as: SYNTHROID Take 100 mcg by mouth daily before breakfast.   loratadine 5 MG chewable tablet Commonly known as: CLARITIN Chew 5 mg by mouth daily.   losartan 100 MG tablet Commonly known as: COZAAR Take 100 mg by mouth daily.   Magnesium Oxide -Mg Supplement 500 MG Tabs Take 500 mg by mouth daily.   metoprolol succinate 50 MG 24 hr tablet Commonly known as: TOPROL-XL Take 50 mg by mouth 2 (two) times daily.   Multi-Vitamins Tabs Take 1 tablet by mouth daily.   niacin 500 MG tablet Commonly known as: VITAMIN B3 Take 500 mg by mouth at bedtime.   oxyCODONE-acetaminophen 5-325 MG tablet Commonly known as: PERCOCET/ROXICET Take 1-2 tablets by mouth every 4 (four) hours as needed for moderate pain.   polyethylene glycol 17 g packet Commonly known as: MIRALAX / GLYCOLAX Take 17 g by mouth daily as needed for moderate constipation.   Salonpas 3.06-04-08 % Ptch Generic drug: Camphor-Menthol-Methyl Sal Place 1 patch onto the skin daily as needed (pain).   traMADol 50 MG tablet Commonly known as: ULTRAM Take 50 mg by mouth in the morning, at noon, and at bedtime.   vitamin C 1000 MG tablet Take 1,000 mg by mouth daily.         Signed: Ophelia Charter 01/18/2022, 7:49 AM

## 2022-01-18 NOTE — Evaluation (Signed)
Physical Therapy Evaluation and Discharge  Patient Details Name: Douglas Phelps MRN: [id number] DOB: 03/23/1941 Today's Date: 01/18/2022  History of Present Illness  Pt is an 81 y/o M s/p bilateral L4-5 laminectomy/foraminectomy/medial facetectomy. PMH includes GERD, high cholesterol, prostate CA, arthritis, HTN, hypothyroidism, and thyroid disease.   Clinical Impression  Patient evaluated by Physical Therapy with no further acute PT needs identified. All education has been completed and the patient has no further questions. Pt was able to demonstrate transfers and ambulation with gross modified independence and no AD. Pt was educated on precautions, brace application/wearing schedule, appropriate activity progression, and car transfer. See below for any follow-up Physical Therapy or equipment needs. PT is signing off. Thank you for this referral.        Recommendations for follow up therapy are one component of a multi-disciplinary discharge planning process, led by the attending physician.  Recommendations may be updated based on patient status, additional functional criteria and insurance authorization.  Follow Up Recommendations No PT follow up      Assistance Recommended at Discharge PRN  Patient can return home with the following  Assistance with cooking/housework;Assist for transportation;Help with stairs or ramp for entrance    Equipment Recommendations None recommended by PT  Recommendations for Other Services       Functional Status Assessment Patient has had a recent decline in their functional status and demonstrates the ability to make significant improvements in function in a reasonable and predictable amount of time.     Precautions / Restrictions Precautions Precautions: Back Precaution Booklet Issued: Yes (comment) Precaution Comments: Reviewed handout and pt was cued for precautions during functional mobility. Required Braces or Orthoses: Spinal Brace Spinal  Brace: Applied in sitting position Restrictions Weight Bearing Restrictions: No      Mobility  Bed Mobility Overal bed mobility: Modified Independent             General bed mobility comments: HOB flat and rails lowered to simulate home environment.    Transfers Overall transfer level: Modified independent Equipment used: None Transfers: Sit to/from Stand             General transfer comment: No assist to power up to full stand. No unsteadiness or LOB noted.    Ambulation/Gait Ambulation/Gait assistance: Modified independent (Device/Increase time) Gait Distance (Feet): 400 Feet Assistive device: None Gait Pattern/deviations: Step-through pattern, Decreased stride length, Trunk flexed Gait velocity: Decreased Gait velocity interpretation: <1.31 ft/sec, indicative of household ambulator   General Gait Details: No assist required. Slow but generally steady with no AD required. VC's for improved posture and forward gaze.  Stairs Stairs:  (Pt declined stair training)          Wheelchair Mobility    Modified Rankin (Stroke Patients Only)       Balance Overall balance assessment: Mild deficits observed, not formally tested                                           Pertinent Vitals/Pain Pain Assessment Pain Assessment: Faces Faces Pain Scale: Hurts a little bit    Home Living Family/patient expects to be discharged to:: Private residence Living Arrangements: Spouse/significant other Available Help at Discharge: Family;Available 24 hours/day Type of Home: House Home Access: Stairs to enter   Entrance Stairs-Number of Steps: 4 Alternate Level Stairs-Number of Steps: flight Home Layout: Two level Home Equipment: Human resources officer  Walker (2 wheels);Cane - single point      Prior Function Prior Level of Function : Independent/Modified Independent             Mobility Comments: no AD use ADLs Comments: ind     Hand  Dominance        Extremity/Trunk Assessment   Upper Extremity Assessment Upper Extremity Assessment: Overall WFL for tasks assessed    Lower Extremity Assessment Lower Extremity Assessment: Generalized weakness (Mild; consistent with pre-op diagnosis)    Cervical / Trunk Assessment Cervical / Trunk Assessment: Back Surgery  Communication   Communication: No difficulties  Cognition Arousal/Alertness: Awake/alert Behavior During Therapy: WFL for tasks assessed/performed Overall Cognitive Status: Within Functional Limits for tasks assessed                                          General Comments General comments (skin integrity, edema, etc.): VSS on RA    Exercises     Assessment/Plan    PT Assessment Patient does not need any further PT services  PT Problem List         PT Treatment Interventions      PT Goals (Current goals can be found in the Care Plan section)  Acute Rehab PT Goals Patient Stated Goal: Home today PT Goal Formulation: All assessment and education complete, DC therapy    Frequency       Co-evaluation               AM-PAC PT "6 Clicks" Mobility  Outcome Measure Help needed turning from your back to your side while in a flat bed without using bedrails?: None Help needed moving from lying on your back to sitting on the side of a flat bed without using bedrails?: None Help needed moving to and from a bed to a chair (including a wheelchair)?: None Help needed standing up from a chair using your arms (e.g., wheelchair or bedside chair)?: None Help needed to walk in hospital room?: None Help needed climbing 3-5 steps with a railing? : None 6 Click Score: 24    End of Session Equipment Utilized During Treatment: Gait belt Activity Tolerance: Patient tolerated treatment well Patient left: in bed;with call bell/phone within reach;with family/visitor present Nurse Communication: Mobility status PT Visit Diagnosis:  Unsteadiness on feet (R26.81);Pain Pain - part of body:  (back)    Time: 3300-7622 PT Time Calculation (min) (ACUTE ONLY): 11 min   Charges:   PT Evaluation $PT Eval Low Complexity: 1 Low          Rolinda Roan, PT, DPT Acute Rehabilitation Services Secure Chat Preferred Office: [phone]   Thelma Comp 01/18/2022, 1:21 PM

## 2022-01-18 NOTE — Evaluation (Signed)
Occupational Therapy Evaluation Patient Details Name: Douglas Phelps MRN: [id number] DOB: 03-16-1941 Today's Date: 01/18/2022   History of Present Illness Pt is an 81 y/o M s/p bilateral L4-5 laminectomy/foraminectomy/medial facetectomy. PMH includes GERD, high cholesterol, prostate CA, arthritis, HTN, hypothyroidism, and thyroid disease.   Clinical Impression   Pt independent at baseline with ADLs and functional mobility, lives with spouse and has son/daughter available as well at d/c to assist. Pt needing mod I -min A for ADLs, mod I for bed mobility using log roll technique, and supervision for transfers without AD. Pt educated on compensatory strategies for ADLs, precautions, and brace wear. Pt adhering to precautions well throughout session. Pt presenting with impairments listed below, however has no acute OT needs at this time, will s/o. Recommend d/c home with family assistance.     Recommendations for follow up therapy are one component of a multi-disciplinary discharge planning process, led by the attending physician.  Recommendations may be updated based on patient status, additional functional criteria and insurance authorization.   Follow Up Recommendations  No OT follow up    Assistance Recommended at Discharge Intermittent Supervision/Assistance  Patient can return home with the following A little help with bathing/dressing/bathroom;Assistance with cooking/housework;Assist for transportation;Help with stairs or ramp for entrance    Functional Status Assessment  Patient has had a recent decline in their functional status and demonstrates the ability to make significant improvements in function in a reasonable and predictable amount of time.  Equipment Recommendations  None recommended by OT (pt has all needed DME)    Recommendations for Other Services       Precautions / Restrictions Precautions Precautions: Back Precaution Booklet Issued: Yes (comment) Precaution  Comments: educated pt on 3/3 back precautions Required Braces or Orthoses: Spinal Brace Spinal Brace: Applied in sitting position Restrictions Weight Bearing Restrictions: No      Mobility Bed Mobility Overal bed mobility: Modified Independent             General bed mobility comments: use of log roll  technique    Transfers Overall transfer level: Needs assistance Equipment used: None Transfers: Sit to/from Stand Sit to Stand: Supervision                  Balance Overall balance assessment: Mild deficits observed, not formally tested                                         ADL either performed or assessed with clinical judgement   ADL Overall ADL's : Needs assistance/impaired Eating/Feeding: Modified independent   Grooming: Modified independent   Upper Body Bathing: Min guard   Lower Body Bathing: Moderate assistance   Upper Body Dressing : Min guard   Lower Body Dressing: Moderate assistance   Toilet Transfer: Supervision/safety   Toileting- Clothing Manipulation and Hygiene: Supervision/safety       Functional mobility during ADLs: Supervision/safety       Vision   Vision Assessment?: No apparent visual deficits     Perception     Praxis      Pertinent Vitals/Pain Pain Assessment Pain Assessment: No/denies pain     Hand Dominance     Extremity/Trunk Assessment Upper Extremity Assessment Upper Extremity Assessment: Overall WFL for tasks assessed   Lower Extremity Assessment Lower Extremity Assessment: Overall WFL for tasks assessed   Cervical / Trunk Assessment Cervical / Trunk Assessment: Back  Surgery   Communication Communication Communication: No difficulties   Cognition Arousal/Alertness: Awake/alert Behavior During Therapy: WFL for tasks assessed/performed Overall Cognitive Status: Within Functional Limits for tasks assessed                                       General Comments   VSS on RA    Exercises     Shoulder Instructions      Home Living Family/patient expects to be discharged to:: Private residence Living Arrangements: Spouse/significant other Available Help at Discharge: Family;Available 24 hours/day Type of Home: House Home Access: Stairs to enter [employer] of Steps: 4   Home Layout: Two level Alternate Level Stairs-Number of Steps: flight   Bathroom Shower/Tub: Hospital doctor Toilet: Handicapped height     Home Equipment: Advice worker (2 wheels);Cane - single point          Prior Functioning/Environment Prior Level of Function : Independent/Modified Independent             Mobility Comments: no AD use ADLs Comments: ind        OT Problem List: Decreased strength;Decreased range of motion;Decreased activity tolerance;Impaired balance (sitting and/or standing)      OT Treatment/Interventions:      OT Goals(Current goals can be found in the care plan section) Acute Rehab OT Goals Patient Stated Goal: none stated OT Goal Formulation: With patient Time For Goal Achievement: 02/01/22 Potential to Achieve Goals: Good  OT Frequency:      Co-evaluation              AM-PAC OT "6 Clicks" Daily Activity     Outcome Measure Help from another person eating meals?: None Help from another person taking care of personal grooming?: None Help from another person toileting, which includes using toliet, bedpan, or urinal?: None Help from another person bathing (including washing, rinsing, drying)?: A Little Help from another person to put on and taking off regular upper body clothing?: None Help from another person to put on and taking off regular lower body clothing?: A Little 6 Click Score: 22   End of Session Equipment Utilized During Treatment: Back brace Nurse Communication: Mobility status  Activity Tolerance: Patient tolerated treatment well Patient left: in bed;with call bell/phone  within reach;with family/visitor present  OT Visit Diagnosis: Unsteadiness on feet (R26.81);Other abnormalities of gait and mobility (R26.89);Muscle weakness (generalized) (M62.81)                Time: 8756-4332 OT Time Calculation (min): 24 min Charges:  OT General Charges $OT Visit: 1 Visit OT Evaluation $OT Eval Low Complexity: 1 Low OT Treatments $Self Care/Home Management : 8-22 mins  Lynnda Child, OTD, OTR/L Acute Rehab [phone]) [phone] - [place] 01/18/2022, 12:07 PM

## 2022-01-18 NOTE — Progress Notes (Signed)
Patient alert and oriented, mae's well, voiding adequate amount of urine, swallowing without difficulty, no c/o pain at time of discharge. Patient discharged home with family. Script and discharged instructions given to patient. Patient and family stated understanding of instructions given. Patient has an appointment with Dr. Arnoldo Morale in 2 weeks

## 2022-01-19 MED FILL — Heparin Sodium (Porcine) Inj 1000 Unit/ML: INTRAMUSCULAR | Qty: 30 | Status: AC

## 2022-01-19 MED FILL — Sodium Chloride IV Soln 0.9%: INTRAVENOUS | Qty: 1000 | Status: AC

## 2022-03-28 ENCOUNTER — Encounter (INDEPENDENT_AMBULATORY_CARE_PROVIDER_SITE_OTHER): Payer: Self-pay

## 2022-07-18 ENCOUNTER — Ambulatory Visit (INDEPENDENT_AMBULATORY_CARE_PROVIDER_SITE_OTHER): Payer: Medicare PPO | Admitting: Dermatology

## 2022-07-18 VITALS — BP 107/55

## 2022-07-18 DIAGNOSIS — L821 Other seborrheic keratosis: Secondary | ICD-10-CM | POA: Diagnosis not present

## 2022-07-18 DIAGNOSIS — L820 Inflamed seborrheic keratosis: Secondary | ICD-10-CM | POA: Diagnosis not present

## 2022-07-18 DIAGNOSIS — L578 Other skin changes due to chronic exposure to nonionizing radiation: Secondary | ICD-10-CM

## 2022-07-18 NOTE — Progress Notes (Unsigned)
   Follow-Up Visit   Subjective  Douglas Phelps is a 82 y.o. male who presents for the following: Other (Spot of left nose that is irritating and growing). The patient has spots, moles and lesions to be evaluated, some may be new or changing and the patient has concerns that these could be cancer.  The following portions of the chart were reviewed this encounter and updated as appropriate:   Tobacco  Allergies  Meds  Problems  Med Hx  Surg Hx  Fam Hx     Review of Systems:  No other skin or systemic complaints except as noted in HPI or Assessment and Plan.  Objective  Well appearing patient in no apparent distress; mood and affect are within normal limits.  A focused examination was performed including face, arms. Relevant physical exam findings are noted in the Assessment and Plan.  Left nasal ala x 1, left forearm x 1 (2) Erythematous stuck-on, waxy papule or plaque       Assessment & Plan   Actinic Damage - chronic, secondary to cumulative UV radiation exposure/sun exposure over time - diffuse scaly erythematous macules with underlying dyspigmentation - Recommend daily broad spectrum sunscreen SPF 30+ to sun-exposed areas, reapply every 2 hours as needed.  - Recommend staying in the shade or wearing long sleeves, sun glasses (UVA+UVB protection) and wide brim hats (4-inch brim around the entire circumference of the hat). - Call for new or changing lesions.  Seborrheic Keratoses - Stuck-on, waxy, tan-brown papules and/or plaques  - Benign-appearing - Discussed benign etiology and prognosis. - Observe - Call for any changes  Inflamed seborrheic keratosis (2) Left nasal ala x 1, left forearm x 1  May plan biopsy to left nasal ala if persistent  Destruction of lesion - Left nasal ala x 1, left forearm x 1 Complexity: simple   Destruction method: cryotherapy   Informed consent: discussed and consent obtained   Timeout:  patient name, date of birth, surgical  site, and procedure verified Lesion destroyed using liquid nitrogen: Yes   Region frozen until ice ball extended beyond lesion: Yes   Outcome: patient tolerated procedure well with no complications   Post-procedure details: wound care instructions given     Return for Follow up as scheduled, [facility].  I, Ashok Cordia, CMA, am acting as scribe for Sarina Ser, MD . Documentation: I have reviewed the above documentation for accuracy and completeness, and I agree with the above.  Sarina Ser, MD

## 2022-07-18 NOTE — Patient Instructions (Signed)
Cryotherapy Aftercare  Wash gently with soap and water everyday.   Apply Vaseline and Band-Aid daily until healed.     Due to recent changes in healthcare laws, you may see results of your pathology and/or laboratory studies on MyChart before the doctors have had a chance to review them. We understand that in some cases there may be results that are confusing or concerning to you. Please understand that not all results are received at the same time and often the doctors may need to interpret multiple results in order to provide you with the best plan of care or course of treatment. Therefore, we ask that you please give us 2 business days to thoroughly review all your results before contacting the office for clarification. Should we see a critical lab result, you will be contacted sooner.   If You Need Anything After Your Visit  If you have any questions or concerns for your doctor, please call our main line at [phone] and press option 4 to reach your doctor's medical assistant. If no one answers, please leave a voicemail as directed and we will return your call as soon as possible. Messages left after 4 pm will be answered the following business day.   You may also send us a message via MyChart. We typically respond to MyChart messages within 1-2 business days.  For prescription refills, please ask your pharmacy to contact our office. Our fax number is [phone].  If you have an urgent issue when the clinic is closed that cannot wait until the next business day, you can page your doctor at the number below.    Please note that while we do our best to be available for urgent issues outside of office hours, we are not available 24/7.   If you have an urgent issue and are unable to reach us, you may choose to seek medical care at your doctor's office, retail clinic, urgent care center, or emergency room.  If you have a medical emergency, please immediately call 911 or go to the  emergency department.  Pager Numbers  - Dr. Kowalski: [phone]  - Dr. Moye: [phone]  - Dr. Stewart: [phone]  In the event of inclement weather, please call our main line at [phone] for an update on the status of any delays or closures.  Dermatology Medication Tips: Please keep the boxes that topical medications come in in order to help keep track of the instructions about where and how to use these. Pharmacies typically print the medication instructions only on the boxes and not directly on the medication tubes.   If your medication is too expensive, please contact our office at [phone] option 4 or send us a message through MyChart.   We are unable to tell what your co-pay for medications will be in advance as this is different depending on your insurance coverage. However, we may be able to find a substitute medication at lower cost or fill out paperwork to get insurance to cover a needed medication.   If a prior authorization is required to get your medication covered by your insurance company, please allow us 1-2 business days to complete this process.  Drug prices often vary depending on where the prescription is filled and some pharmacies may offer cheaper prices.  The website www.goodrx.com contains coupons for medications through different pharmacies. The prices here do not account for what the cost may be with help from insurance (it may be cheaper with your insurance), but the website can   give you the price if you did not use any insurance.  - You can print the associated coupon and take it with your prescription to the pharmacy.  - You may also stop by our office during regular business hours and pick up a GoodRx coupon card.  - If you need your prescription sent electronically to a different pharmacy, notify our office through [facility] MyChart or by phone at [phone] option 4.     Si Usted Necesita Algo Despus de Su Visita  Tambin puede  enviarnos un mensaje a travs de MyChart. Por lo general respondemos a los mensajes de MyChart en el transcurso de 1 a 2 das hbiles.  Para renovar recetas, por favor pida a su farmacia que se ponga en contacto con nuestra oficina. Nuestro nmero de fax es el [phone].  Si tiene un asunto urgente cuando la clnica est cerrada y que no puede esperar hasta el siguiente da hbil, puede llamar/localizar a su doctor(a) al nmero que aparece a continuacin.   Por favor, tenga en cuenta que aunque hacemos todo lo posible para estar disponibles para asuntos urgentes fuera del horario de oficina, no estamos disponibles las 24 horas del da, los 7 das de la semana.   Si tiene un problema urgente y no puede comunicarse con nosotros, puede optar por buscar atencin mdica  en el consultorio de su doctor(a), en una clnica privada, en un centro de atencin urgente o en una sala de emergencias.  Si tiene una emergencia mdica, por favor llame inmediatamente al 911 o vaya a la sala de emergencias.  Nmeros de bper  - Dr. Kowalski: [phone]  - Dra. Moye: [phone]  - Dra. Stewart: [phone]  En caso de inclemencias del tiempo, por favor llame a nuestra lnea principal al [phone] para una actualizacin sobre el estado de cualquier retraso o cierre.  Consejos para la medicacin en dermatologa: Por favor, guarde las cajas en las que vienen los medicamentos de uso tpico para ayudarle a seguir las instrucciones sobre dnde y cmo usarlos. Las farmacias generalmente imprimen las instrucciones del medicamento slo en las cajas y no directamente en los tubos del medicamento.   Si su medicamento es muy caro, por favor, pngase en contacto con nuestra oficina llamando al [phone] y presione la opcin 4 o envenos un mensaje a travs de MyChart.   No podemos decirle cul ser su copago por los medicamentos por adelantado ya que esto es diferente dependiendo de la cobertura de su seguro.  Sin embargo, es posible que podamos encontrar un medicamento sustituto a menor costo o llenar un formulario para que el seguro cubra el medicamento que se considera necesario.   Si se requiere una autorizacin previa para que su compaa de seguros cubra su medicamento, por favor permtanos de 1 a 2 das hbiles para completar este proceso.  Los precios de los medicamentos varan con frecuencia dependiendo del lugar de dnde se surte la receta y alguna farmacias pueden ofrecer precios ms baratos.  El sitio web www.goodrx.com tiene cupones para medicamentos de diferentes farmacias. Los precios aqu no tienen en cuenta lo que podra costar con la ayuda del seguro (puede ser ms barato con su seguro), pero el sitio web puede darle el precio si no utiliz ningn seguro.  - Puede imprimir el cupn correspondiente y llevarlo con su receta a la farmacia.  - Tambin puede pasar por nuestra oficina durante el horario de atencin regular y recoger una tarjeta de cupones de GoodRx.  -   Si necesita que su receta se enve electrnicamente a una farmacia diferente, informe a nuestra oficina a travs de MyChart de [place] o por telfono llamando al [phone] y presione la opcin 4.  

## 2022-07-20 ENCOUNTER — Encounter: Payer: Self-pay | Admitting: Dermatology

## 2022-08-15 ENCOUNTER — Other Ambulatory Visit (INDEPENDENT_AMBULATORY_CARE_PROVIDER_SITE_OTHER): Payer: Self-pay | Admitting: Vascular Surgery

## 2022-08-15 DIAGNOSIS — I6523 Occlusion and stenosis of bilateral carotid arteries: Secondary | ICD-10-CM

## 2022-08-16 ENCOUNTER — Ambulatory Visit (INDEPENDENT_AMBULATORY_CARE_PROVIDER_SITE_OTHER): Payer: Medicare PPO

## 2022-08-16 ENCOUNTER — Ambulatory Visit (INDEPENDENT_AMBULATORY_CARE_PROVIDER_SITE_OTHER): Payer: Medicare PPO | Admitting: Vascular Surgery

## 2022-08-16 ENCOUNTER — Encounter (INDEPENDENT_AMBULATORY_CARE_PROVIDER_SITE_OTHER): Payer: Self-pay | Admitting: Vascular Surgery

## 2022-08-16 VITALS — BP 133/60 | HR 62 | Resp 16 | Wt 173.6 lb

## 2022-08-16 DIAGNOSIS — I6523 Occlusion and stenosis of bilateral carotid arteries: Secondary | ICD-10-CM

## 2022-08-16 DIAGNOSIS — Z951 Presence of aortocoronary bypass graft: Secondary | ICD-10-CM | POA: Diagnosis not present

## 2022-08-16 DIAGNOSIS — I10 Essential (primary) hypertension: Secondary | ICD-10-CM | POA: Diagnosis not present

## 2022-08-16 DIAGNOSIS — E7800 Pure hypercholesterolemia, unspecified: Secondary | ICD-10-CM | POA: Diagnosis not present

## 2022-08-16 NOTE — Progress Notes (Signed)
MRN : [id number]  Douglas Phelps is a 82 y.o. (10/03/1940) male who presents with chief complaint of  Chief Complaint  Patient presents with   Follow-up    Ultrasound follow up  .  History of Present Illness: Patient returns in follow-up of his carotid disease.  He is doing well today without any complaints.  He denies any focal neurologic symptoms. Specifically, the patient denies amaurosis fugax, speech or swallowing difficulties, or arm or leg weakness or numbness.  Duplex today shows stable 1 to 39% ICA stenosis bilaterally stays on the upper end of that range on each side, but no progression from previous study last year.  Current Outpatient Medications  Medication Sig Dispense Refill   amLODipine (NORVASC) 5 MG tablet Take 5 mg by mouth 2 (two) times daily.      Ascorbic Acid (VITAMIN C) 1000 MG tablet Take 1,000 mg by mouth daily.      aspirin EC 81 MG tablet Take 81 mg by mouth daily.     Calcium Carbonate-Vitamin D (CALCIUM-VITAMIN D3 PO) Take 1 tablet by mouth in the morning and at bedtime.     Camphor-Menthol-Methyl Sal (SALONPAS) 3.06-04-08 % PTCH Place 1 patch onto the skin daily as needed (pain).     cyanocobalamin (VITAMIN B12) 1000 MCG/ML injection Inject into the muscle every 30 (thirty) days.     docusate sodium (COLACE) 100 MG capsule Take 100 mg by mouth 2 (two) times daily.     esomeprazole (NEXIUM) 40 MG capsule Take 40 mg by mouth every other day.      famotidine (PEPCID) 20 MG tablet Take 20 mg by mouth every other day.     guaiFENesin (MUCINEX) 600 MG 12 hr tablet Take 600 mg by mouth at bedtime as needed (congestion).     levothyroxine (SYNTHROID) 100 MCG tablet Take 100 mcg by mouth daily before breakfast.     loratadine (CLARITIN) 5 MG chewable tablet Chew 5 mg by mouth daily.     Magnesium Oxide 500 MG TABS Take 500 mg by mouth daily.      metoprolol succinate (TOPROL-XL) 50 MG 24 hr tablet Take 50 mg by mouth 2 (two) times daily.      Multiple Vitamin  (MULTI-VITAMINS) TABS Take 1 tablet by mouth daily.      niacin (VITAMIN B3) 500 MG tablet Take 500 mg by mouth at bedtime.     polyethylene glycol (MIRALAX / GLYCOLAX) 17 g packet Take 17 g by mouth daily as needed for moderate constipation.     traMADol (ULTRAM) 50 MG tablet Take 50 mg by mouth in the morning, at noon, and at bedtime.     acetaminophen (TYLENOL) 500 MG tablet Take 1,000 mg by mouth at bedtime.     aspirin EC 81 MG tablet Take 81 mg by mouth daily.      cyclobenzaprine (FLEXERIL) 5 MG tablet Take 1 tablet (5 mg total) by mouth 3 (three) times daily as needed for muscle spasms. 30 tablet 0   doxazosin (CARDURA) 4 MG tablet Take 4 mg by mouth daily.     losartan (COZAAR) 100 MG tablet Take 100 mg by mouth daily.      oxyCODONE-acetaminophen (PERCOCET/ROXICET) 5-325 MG tablet Take 1-2 tablets by mouth every 4 (four) hours as needed for moderate pain. 30 tablet 0   No current facility-administered medications for this visit.    Past Medical History:  Diagnosis Date   Actinic keratosis    Arthritis  Cancer ([place]) 2015   prostate   GERD (gastroesophageal reflux disease)    High cholesterol    History of hiatal hernia    Hx of CABG    Hypertension    Hypothyroidism    PONV (postoperative nausea and vomiting)    Premature ventricular contractions    Renal disorder    CKD stg 3   SI (sacroiliac) joint dysfunction    Thyroid disease     Past Surgical History:  Procedure Laterality Date   APPENDECTOMY     CARDIAC SURGERY     CABG x 3  06/2000   CORONARY ARTERY BYPASS GRAFT     HERNIA REPAIR Right 2015   groin   PROSTATE SURGERY  10/2013    Family History No bleeding disorders, clotting disorders, autoimmune diseases, or aneurysms   Social History        Social History  Substance Use Topics   Smoking status: Never Smoker   Smokeless tobacco: Never Used   Alcohol use No  No IVDU            Allergies  Allergen Reactions   Lotensin [Benazepril] Other  (See Comments)      Other reaction(s): Other (See Comments) cough Cough     Prilosec [Omeprazole] Other (See Comments)      Other reaction(s): Other (See Comments) headache headache   Benadryl [Diphenhydramine Hcl (Sleep)] Other (See Comments)      Jittery     Hydralazine Hcl        Other reaction(s): Kidney Disorder          REVIEW OF SYSTEMS (Negative unless checked)   Constitutional: [] Weight loss  [] Fever  [] Chills Cardiac: [] Chest pain   [] Chest pressure   [] Palpitations   [] Shortness of breath when laying flat   [] Shortness of breath at rest   [] Shortness of breath with exertion. Vascular:  [] Pain in legs with walking   [] Pain in legs at rest   [] Pain in legs when laying flat   [] Claudication   [] Pain in feet when walking  [] Pain in feet at rest  [] Pain in feet when laying flat   [] History of DVT   [] Phlebitis   [] Swelling in legs   [] Varicose veins   [] Non-healing ulcers Pulmonary:   [] Uses home oxygen   [] Productive cough   [] Hemoptysis   [] Wheeze  [] COPD   [] Asthma Neurologic:  [] Dizziness  [] Blackouts   [] Seizures   [] History of stroke   [] History of TIA  [] Aphasia   [] Temporary blindness   [] Dysphagia   [] Weakness or numbness in arms   [] Weakness or numbness in legs Musculoskeletal:  [x] Arthritis   [] Joint swelling   [] Joint pain   [] Low back pain Hematologic:  [] Easy bruising  [] Easy bleeding   [] Hypercoagulable state   [] Anemic  [] Hepatitis Gastrointestinal:  [] Blood in stool   [] Vomiting blood  [] Gastroesophageal reflux/heartburn   [] Abdominal pain Genitourinary:  [] Chronic kidney disease   [] Difficult urination  [] Frequent urination  [] Burning with urination   [] Hematuria Skin:  [] Rashes   [] Ulcers   [] Wounds Psychological:  [] History of anxiety   []  History of major depression.    Physical Examination  Vitals:   08/16/22 1438  BP: 133/60  Pulse: 62  Resp: 16  Weight: 173 lb 9.6 oz (78.7 kg)   Body mass index is 28.02 kg/m. Gen:  WD/WN, NAD Head: [place]/AT, No  temporalis wasting. Ear/Nose/Throat: Hearing grossly intact, nares w/o erythema or drainage, trachea midline Eyes: Conjunctiva clear. Sclera non-icteric Neck:  Supple.  No bruit  Pulmonary:  Good air movement, equal and clear to auscultation bilaterally.  Cardiac: RRR, No JVD Vascular:  Vessel Right Left  Radial Palpable Palpable       Musculoskeletal: M/S 5/5 throughout.  No deformity or atrophy. No edema. Neurologic: CN 2-12 intact. Sensation grossly intact in extremities.  Symmetrical.  Speech is fluent. Motor exam as listed above. Psychiatric: Judgment intact, Mood & affect appropriate for pt's clinical situation. Dermatologic: No rashes or ulcers noted.  No cellulitis or open wounds.   CBC Lab Results  Component Value Date   WBC 4.7 01/07/2022   HGB 12.4 (L) 01/07/2022   HCT 36.4 (L) 01/07/2022   MCV 98.1 01/07/2022   PLT 146 (L) 01/07/2022    BMET    Component Value Date/Time   NA 135 01/07/2022 1100   NA 134 (L) 01/26/2013 1511   K 5.0 01/07/2022 1100   K 4.8 01/26/2013 1511   CL 104 01/07/2022 1100   CL 103 01/26/2013 1511   CO2 25 01/07/2022 1100   CO2 25 01/26/2013 1511   GLUCOSE 100 (H) 01/07/2022 1100   GLUCOSE 133 (H) 01/26/2013 1511   BUN 22 01/07/2022 1100   BUN 30 (H) 01/26/2013 1511   CREATININE 1.34 (H) 01/07/2022 1100   CREATININE 1.52 (H) 01/26/2013 1511   CALCIUM 9.4 01/07/2022 1100   CALCIUM 9.3 01/26/2013 1511   GFRNONAA 54 (L) 01/07/2022 1100   GFRNONAA 45 (L) 01/26/2013 1511   GFRAA 56 (L) 05/28/2019 1750   GFRAA 53 (L) 01/26/2013 1511   CrCl cannot be calculated (Patient's most recent lab result is older than the maximum 21 days allowed.).  COAG No results found for: "INR", "PROTIME"  Radiology No results found.   Assessment/Plan Bilateral carotid artery stenosis Duplex today shows stable 1 to 39% ICA stenosis bilaterally stays on the upper end of that range on each side, but no progression from previous study last year.  No role  for intervention at this degree of stenosis.  Continue to follow on an annual basis.  High cholesterol lipid control important in reducing the progression of atherosclerotic disease.      Hypertension blood pressure control important in reducing the progression of atherosclerotic disease. On appropriate oral medications.     Hx of CABG No recent anginal symptoms  Leotis Pain, MD  08/16/2022 3:05 PM    This note was created with Dragon medical transcription system.  Any errors from dictation are purely unintentional

## 2022-08-16 NOTE — Assessment & Plan Note (Signed)
Duplex today shows stable 1 to 39% ICA stenosis bilaterally stays on the upper end of that range on each side, but no progression from previous study last year.  No role for intervention at this degree of stenosis.  Continue to follow on an annual basis.

## 2022-08-31 ENCOUNTER — Ambulatory Visit (INDEPENDENT_AMBULATORY_CARE_PROVIDER_SITE_OTHER): Payer: Medicare PPO | Admitting: Dermatology

## 2022-08-31 ENCOUNTER — Encounter: Payer: Self-pay | Admitting: Dermatology

## 2022-08-31 VITALS — BP 94/57

## 2022-08-31 DIAGNOSIS — L578 Other skin changes due to chronic exposure to nonionizing radiation: Secondary | ICD-10-CM | POA: Diagnosis not present

## 2022-08-31 DIAGNOSIS — D229 Melanocytic nevi, unspecified: Secondary | ICD-10-CM | POA: Diagnosis not present

## 2022-08-31 DIAGNOSIS — L814 Other melanin hyperpigmentation: Secondary | ICD-10-CM

## 2022-08-31 DIAGNOSIS — L570 Actinic keratosis: Secondary | ICD-10-CM | POA: Diagnosis not present

## 2022-08-31 DIAGNOSIS — L820 Inflamed seborrheic keratosis: Secondary | ICD-10-CM

## 2022-08-31 DIAGNOSIS — L821 Other seborrheic keratosis: Secondary | ICD-10-CM

## 2022-08-31 DIAGNOSIS — D1801 Hemangioma of skin and subcutaneous tissue: Secondary | ICD-10-CM

## 2022-08-31 DIAGNOSIS — Z1283 Encounter for screening for malignant neoplasm of skin: Secondary | ICD-10-CM | POA: Diagnosis not present

## 2022-08-31 NOTE — Progress Notes (Signed)
Follow-Up Visit   Subjective  Douglas Phelps is a 82 y.o. male who presents for the following: Skin Cancer Screening and Full Body Skin Exam - No history of skin cancer or abnormal moles. History of AK The patient presents for Total-Body Skin Exam (TBSE) for skin cancer screening and mole check. The patient has spots, moles and lesions to be evaluated, some may be new or changing and the patient has concerns that these could be cancer.  The following portions of the chart were reviewed this encounter and updated as appropriate: medications, allergies, medical history  Review of Systems:  No other skin or systemic complaints except as noted in HPI or Assessment and Plan.  Objective  Well appearing patient in no apparent distress; mood and affect are within normal limits.  A full examination was performed including scalp, head, eyes, ears, nose, lips, neck, chest, axillae, abdomen, back, buttocks, bilateral upper extremities, bilateral lower extremities, hands, feet, fingers, toes, fingernails, and toenails. All findings within normal limits unless otherwise noted below.   Relevant physical exam findings are noted in the Assessment and Plan.   Assessment & Plan   LENTIGINES, SEBORRHEIC KERATOSES, HEMANGIOMAS - Benign normal skin lesions - Benign-appearing - Call for any changes  MELANOCYTIC NEVI - Tan-brown and/or pink-flesh-colored symmetric macules and papules - Benign appearing on exam today - Observation - Call clinic for new or changing moles - Recommend daily use of broad spectrum spf 30+ sunscreen to sun-exposed areas.   ACTINIC DAMAGE - Chronic condition, secondary to cumulative UV/sun exposure - diffuse scaly erythematous macules with underlying dyspigmentation - Recommend daily broad spectrum sunscreen SPF 30+ to sun-exposed areas, reapply every 2 hours as needed.  - Staying in the shade or wearing long sleeves, sun glasses (UVA+UVB protection) and wide brim hats  (4-inch brim around the entire circumference of the hat) are also recommended for sun protection.  - Call for new or changing lesions.  SKIN CANCER SCREENING PERFORMED TODAY.  ACTINIC KERATOSIS Exam: Erythematous thin papules/macules with gritty scale  Actinic keratoses are precancerous spots that appear secondary to cumulative UV radiation exposure/sun exposure over time. They are chronic with expected duration over 1 year. A portion of actinic keratoses will progress to squamous cell carcinoma of the skin. It is not possible to reliably predict which spots will progress to skin cancer and so treatment is recommended to prevent development of skin cancer.  Recommend daily broad spectrum sunscreen SPF 30+ to sun-exposed areas, reapply every 2 hours as needed.  Recommend staying in the shade or wearing long sleeves, sun glasses (UVA+UVB protection) and wide brim hats (4-inch brim around the entire circumference of the hat). Call for new or changing lesions.  Treatment Plan:  Prior to procedure, discussed risks of blister formation, small wound, skin dyspigmentation, or rare scar following cryotherapy. Recommend Vaseline ointment to treated areas while healing.  Destruction Procedure Note Destruction method: cryotherapy   Informed consent: discussed and consent obtained   Lesion destroyed using liquid nitrogen: Yes   Outcome: patient tolerated procedure well with no complications   Post-procedure details: wound care instructions given   Locations: scalp, cheeks # of Lesions Treated: 4   INFLAMED SEBORRHEIC KERATOSIS Exam: Erythematous keratotic or waxy stuck-on papule or plaque. Symptomatic, irritating, patient would like treated. Benign-appearing.  Call clinic for new or changing lesions.  Prior to procedure, discussed risks of blister formation, small wound, skin dyspigmentation, or rare scar following treatment. Recommend Vaseline ointment to treated areas while  healing.  Destruction Procedure Note Destruction method: cryotherapy   Informed consent: discussed and consent obtained   Lesion destroyed using liquid nitrogen: Yes   Outcome: patient tolerated procedure well with no complications   Post-procedure details: wound care instructions given   Locations: cheeks # of Lesions Treated: 4  Return in about 1 year (around 08/31/2023) for TBSE.  I, Ashok Cordia, CMA, am acting as scribe for Sarina Ser, MD .  Documentation: I have reviewed the above documentation for accuracy and completeness, and I agree with the above.  Sarina Ser, MD

## 2022-08-31 NOTE — Patient Instructions (Signed)
Cryotherapy Aftercare  Wash gently with soap and water everyday.   Apply Vaseline and Band-Aid daily until healed.     Due to recent changes in healthcare laws, you may see results of your pathology and/or laboratory studies on MyChart before the doctors have had a chance to review them. We understand that in some cases there may be results that are confusing or concerning to you. Please understand that not all results are received at the same time and often the doctors may need to interpret multiple results in order to provide you with the best plan of care or course of treatment. Therefore, we ask that you please give us 2 business days to thoroughly review all your results before contacting the office for clarification. Should we see a critical lab result, you will be contacted sooner.   If You Need Anything After Your Visit  If you have any questions or concerns for your doctor, please call our main line at [phone] and press option 4 to reach your doctor's medical assistant. If no one answers, please leave a voicemail as directed and we will return your call as soon as possible. Messages left after 4 pm will be answered the following business day.   You may also send us a message via MyChart. We typically respond to MyChart messages within 1-2 business days.  For prescription refills, please ask your pharmacy to contact our office. Our fax number is [phone].  If you have an urgent issue when the clinic is closed that cannot wait until the next business day, you can page your doctor at the number below.    Please note that while we do our best to be available for urgent issues outside of office hours, we are not available 24/7.   If you have an urgent issue and are unable to reach us, you may choose to seek medical care at your doctor's office, retail clinic, urgent care center, or emergency room.  If you have a medical emergency, please immediately call 911 or go to the  emergency department.  Pager Numbers  - Dr. Kowalski: [phone]  - Dr. Moye: [phone]  - Dr. Stewart: [phone]  In the event of inclement weather, please call our main line at [phone] for an update on the status of any delays or closures.  Dermatology Medication Tips: Please keep the boxes that topical medications come in in order to help keep track of the instructions about where and how to use these. Pharmacies typically print the medication instructions only on the boxes and not directly on the medication tubes.   If your medication is too expensive, please contact our office at [phone] option 4 or send us a message through MyChart.   We are unable to tell what your co-pay for medications will be in advance as this is different depending on your insurance coverage. However, we may be able to find a substitute medication at lower cost or fill out paperwork to get insurance to cover a needed medication.   If a prior authorization is required to get your medication covered by your insurance company, please allow us 1-2 business days to complete this process.  Drug prices often vary depending on where the prescription is filled and some pharmacies may offer cheaper prices.  The website www.goodrx.com contains coupons for medications through different pharmacies. The prices here do not account for what the cost may be with help from insurance (it may be cheaper with your insurance), but the website can   give you the price if you did not use any insurance.  - You can print the associated coupon and take it with your prescription to the pharmacy.  - You may also stop by our office during regular business hours and pick up a GoodRx coupon card.  - If you need your prescription sent electronically to a different pharmacy, notify our office through [facility] MyChart or by phone at [phone] option 4.     Si Usted Necesita Algo Despus de Su Visita  Tambin puede  enviarnos un mensaje a travs de MyChart. Por lo general respondemos a los mensajes de MyChart en el transcurso de 1 a 2 das hbiles.  Para renovar recetas, por favor pida a su farmacia que se ponga en contacto con nuestra oficina. Nuestro nmero de fax es el [phone].  Si tiene un asunto urgente cuando la clnica est cerrada y que no puede esperar hasta el siguiente da hbil, puede llamar/localizar a su doctor(a) al nmero que aparece a continuacin.   Por favor, tenga en cuenta que aunque hacemos todo lo posible para estar disponibles para asuntos urgentes fuera del horario de oficina, no estamos disponibles las 24 horas del da, los 7 das de la semana.   Si tiene un problema urgente y no puede comunicarse con nosotros, puede optar por buscar atencin mdica  en el consultorio de su doctor(a), en una clnica privada, en un centro de atencin urgente o en una sala de emergencias.  Si tiene una emergencia mdica, por favor llame inmediatamente al 911 o vaya a la sala de emergencias.  Nmeros de bper  - Dr. Kowalski: [phone]  - Dra. Moye: [phone]  - Dra. Stewart: [phone]  En caso de inclemencias del tiempo, por favor llame a nuestra lnea principal al [phone] para una actualizacin sobre el estado de cualquier retraso o cierre.  Consejos para la medicacin en dermatologa: Por favor, guarde las cajas en las que vienen los medicamentos de uso tpico para ayudarle a seguir las instrucciones sobre dnde y cmo usarlos. Las farmacias generalmente imprimen las instrucciones del medicamento slo en las cajas y no directamente en los tubos del medicamento.   Si su medicamento es muy caro, por favor, pngase en contacto con nuestra oficina llamando al [phone] y presione la opcin 4 o envenos un mensaje a travs de MyChart.   No podemos decirle cul ser su copago por los medicamentos por adelantado ya que esto es diferente dependiendo de la cobertura de su seguro.  Sin embargo, es posible que podamos encontrar un medicamento sustituto a menor costo o llenar un formulario para que el seguro cubra el medicamento que se considera necesario.   Si se requiere una autorizacin previa para que su compaa de seguros cubra su medicamento, por favor permtanos de 1 a 2 das hbiles para completar este proceso.  Los precios de los medicamentos varan con frecuencia dependiendo del lugar de dnde se surte la receta y alguna farmacias pueden ofrecer precios ms baratos.  El sitio web www.goodrx.com tiene cupones para medicamentos de diferentes farmacias. Los precios aqu no tienen en cuenta lo que podra costar con la ayuda del seguro (puede ser ms barato con su seguro), pero el sitio web puede darle el precio si no utiliz ningn seguro.  - Puede imprimir el cupn correspondiente y llevarlo con su receta a la farmacia.  - Tambin puede pasar por nuestra oficina durante el horario de atencin regular y recoger una tarjeta de cupones de GoodRx.  -   Si necesita que su receta se enve electrnicamente a una farmacia diferente, informe a nuestra oficina a travs de MyChart de [facility] o por telfono llamando al [phone] y presione la opcin 4.  

## 2022-12-28 ENCOUNTER — Encounter: Payer: Self-pay | Admitting: Emergency Medicine

## 2022-12-28 ENCOUNTER — Emergency Department
Admission: EM | Admit: 2022-12-28 | Discharge: 2022-12-28 | Disposition: A | Discharge status: Home / Self Care | Payer: Medicare PPO | Attending: Emergency Medicine | Admitting: Emergency Medicine

## 2022-12-28 ENCOUNTER — Other Ambulatory Visit: Payer: Self-pay

## 2022-12-28 DIAGNOSIS — Z79899 Other long term (current) drug therapy: Secondary | ICD-10-CM | POA: Insufficient documentation

## 2022-12-28 DIAGNOSIS — R002 Palpitations: Secondary | ICD-10-CM | POA: Diagnosis present

## 2022-12-28 DIAGNOSIS — Z7982 Long term (current) use of aspirin: Secondary | ICD-10-CM | POA: Insufficient documentation

## 2022-12-28 DIAGNOSIS — E039 Hypothyroidism, unspecified: Secondary | ICD-10-CM | POA: Diagnosis not present

## 2022-12-28 DIAGNOSIS — N183 Chronic kidney disease, stage 3 unspecified: Secondary | ICD-10-CM | POA: Insufficient documentation

## 2022-12-28 DIAGNOSIS — Z79890 Hormone replacement therapy (postmenopausal): Secondary | ICD-10-CM | POA: Insufficient documentation

## 2022-12-28 DIAGNOSIS — E871 Hypo-osmolality and hyponatremia: Secondary | ICD-10-CM | POA: Diagnosis not present

## 2022-12-28 DIAGNOSIS — I129 Hypertensive chronic kidney disease with stage 1 through stage 4 chronic kidney disease, or unspecified chronic kidney disease: Secondary | ICD-10-CM | POA: Insufficient documentation

## 2022-12-28 DIAGNOSIS — Z951 Presence of aortocoronary bypass graft: Secondary | ICD-10-CM | POA: Diagnosis not present

## 2022-12-28 DIAGNOSIS — Z8546 Personal history of malignant neoplasm of prostate: Secondary | ICD-10-CM | POA: Diagnosis not present

## 2022-12-28 LAB — COMPREHENSIVE METABOLIC PANEL
ALT: 14 U/L (ref 0–44)
AST: 21 U/L (ref 15–41)
Albumin: 4.3 g/dL (ref 3.5–5.0)
Alkaline Phosphatase: 52 U/L (ref 38–126)
Anion gap: 7 (ref 5–15)
BUN: 22 mg/dL (ref 8–23)
CO2: 24 mmol/L (ref 22–32)
Calcium: 9 mg/dL (ref 8.9–10.3)
Chloride: 99 mmol/L (ref 98–111)
Creatinine, Ser: 1.37 mg/dL — ABNORMAL HIGH (ref 0.61–1.24)
GFR, Estimated: 52 mL/min — ABNORMAL LOW (ref >60)
Glucose, Bld: 112 mg/dL — ABNORMAL HIGH (ref 70–99)
Potassium: 4.1 mmol/L (ref 3.5–5.1)
Sodium: 130 mmol/L — ABNORMAL LOW (ref 135–145)
Total Bilirubin: 0.6 mg/dL (ref 0.3–1.2)
Total Protein: 7.4 g/dL (ref 6.5–8.1)

## 2022-12-28 LAB — CBC WITH DIFFERENTIAL/PLATELET
Abs Immature Granulocytes: 0.02 10*3/uL (ref 0.00–0.07)
Basophils Absolute: 0 10*3/uL (ref 0.0–0.1)
Basophils Relative: 0 %
Eosinophils Absolute: 0.2 10*3/uL (ref 0.0–0.5)
Eosinophils Relative: 4 %
HCT: 37.6 % — ABNORMAL LOW (ref 39.0–52.0)
Hemoglobin: 13.1 g/dL (ref 13.0–17.0)
Immature Granulocytes: 0 %
Lymphocytes Relative: 26 %
Lymphs Abs: 1.4 10*3/uL (ref 0.7–4.0)
MCH: 33.4 pg (ref 26.0–34.0)
MCHC: 34.8 g/dL (ref 30.0–36.0)
MCV: 95.9 fL (ref 80.0–100.0)
Monocytes Absolute: 0.6 10*3/uL (ref 0.1–1.0)
Monocytes Relative: 12 %
Neutro Abs: 3 10*3/uL (ref 1.7–7.7)
Neutrophils Relative %: 58 %
Platelets: 170 10*3/uL (ref 150–400)
RBC: 3.92 MIL/uL — ABNORMAL LOW (ref 4.22–5.81)
RDW: 13.2 % (ref 11.5–15.5)
WBC: 5.3 10*3/uL (ref 4.0–10.5)
nRBC: 0 % (ref 0.0–0.2)

## 2022-12-28 LAB — TSH: TSH: 3.406 u[IU]/mL (ref 0.350–4.500)

## 2022-12-28 LAB — TROPONIN I (HIGH SENSITIVITY)
Troponin I (High Sensitivity): 14 ng/L (ref <18)
Troponin I (High Sensitivity): 14 ng/L (ref <18)

## 2022-12-28 LAB — T4, FREE: Free T4: 0.84 ng/dL (ref 0.61–1.12)

## 2022-12-28 NOTE — Discharge Instructions (Signed)
Your medicines daily as directed by your doctor.  Avoid excessive caffeine intake.  Return to the ER for worsening symptoms, persistent vomiting, difficulty breathing or other concerns.

## 2022-12-28 NOTE — ED Triage Notes (Addendum)
Patient ambulatory to triage with steady gait, without difficulty or distress noted; pt reports that he awoke with an "irregular heart rate" accomp by dizziness; st hx PVCs

## 2022-12-28 NOTE — ED Provider Notes (Signed)
[facility] Provider Note    Event Date/Time   First MD Initiated Contact with Patient 12/28/22 [phone]     (approximate)   History   Palpitations   HPI  Douglas Phelps is a 82 y.o. male who presents to the ED from home with a chief complaint of palpitations.  Patient awoke to urinate and felt palpitations.  Describes fast heart rate with irregular beats.  History of PVCs, sees Dr. Juliann Pares from cardiology, takes metoprolol.  Denies recent fever/chills, cough, chest pain, shortness of breath, abdominal pain, nausea, vomiting, dysuria, diarrhea or dizziness.  Denies recent travel, trauma or hormone use.  Symptoms have resolved by the time of our interview and examination.     Past Medical History   Past Medical History:  Diagnosis Date   Actinic keratosis    Arthritis    Cancer (HCC) 2015   prostate   GERD (gastroesophageal reflux disease)    High cholesterol    History of hiatal hernia    Hx of CABG    Hypertension    Hypothyroidism    PONV (postoperative nausea and vomiting)    Premature ventricular contractions    Renal disorder    CKD stg 3   SI (sacroiliac) joint dysfunction    Thyroid disease      Active Problem List   Patient Active Problem List   Diagnosis Date Noted   Lumbar stenosis with neurogenic claudication 01/17/2022   Statin myopathy 06/18/2019   Body mass index (BMI) 27.0-27.9, adult 01/07/2019   Low back pain 01/07/2019   Spinal stenosis of lumbar region 01/07/2019   DDD (degenerative disc disease), lumbosacral 08/27/2018   Bilateral carotid artery stenosis 02/20/2017   High cholesterol 01/20/2017   Hypertension 01/20/2017   Hx of CABG 01/20/2017   Carotid stenosis 01/20/2017   Gastroesophageal reflux disease with hiatal hernia 05/03/2016   Abnormal barium swallow 05/03/2016   CKD (chronic kidney disease) stage 3, GFR 30-59 ml/min (HCC) 07/30/2015   Hyperglycemia 03/28/2014   Moderate mitral insufficiency  03/21/2014   Malignant neoplasm of prostate (HCC) 10/11/2013   ASCVD (arteriosclerotic cardiovascular disease) 10/11/2013   Hypothyroidism 10/11/2013     Past Surgical History   Past Surgical History:  Procedure Laterality Date   APPENDECTOMY     CARDIAC SURGERY     CABG x 3  06/2000   CORONARY ARTERY BYPASS GRAFT     HERNIA REPAIR Right 2015   groin   PROSTATE SURGERY  10/2013     Home Medications   Prior to Admission medications   Medication Sig Start Date End Date Taking? Authorizing Provider  acetaminophen (TYLENOL) 500 MG tablet Take 1,000 mg by mouth at bedtime.    [provider]  amLODipine (NORVASC) 5 MG tablet Take 5 mg by mouth 2 (two) times daily.  02/15/16   [provider]  Ascorbic Acid (VITAMIN C) 1000 MG tablet Take 1,000 mg by mouth daily.     [provider]  aspirin EC 81 MG tablet Take 81 mg by mouth daily.     [provider]  aspirin EC 81 MG tablet Take 81 mg by mouth daily.    [provider]  Calcium Carbonate-Vitamin D (CALCIUM-VITAMIN D3 PO) Take 1 tablet by mouth in the morning and at bedtime.    [provider]  Camphor-Menthol-Methyl Sal (SALONPAS) 3.06-04-08 % PTCH Place 1 patch onto the skin daily as needed (pain).    [provider]  cyanocobalamin (VITAMIN  B12) 1000 MCG/ML injection Inject into the muscle every 30 (thirty) days. 06/20/22   [provider]  cyclobenzaprine (FLEXERIL) 5 MG tablet Take 1 tablet (5 mg total) by mouth 3 (three) times daily as needed for muscle spasms. 01/18/22   Tressie Stalker, MD  docusate sodium (COLACE) 100 MG capsule Take 100 mg by mouth 2 (two) times daily.    [provider]  doxazosin (CARDURA) 4 MG tablet Take 4 mg by mouth daily. 02/28/19 01/17/22  [provider]  esomeprazole (NEXIUM) 40 MG capsule Take 40 mg by mouth every other day.  07/08/14   [provider]  famotidine (PEPCID) 20 MG tablet Take 20 mg by mouth  every other day.    [provider]  guaiFENesin (MUCINEX) 600 MG 12 hr tablet Take 600 mg by mouth at bedtime as needed (congestion).    [provider]  levothyroxine (SYNTHROID) 100 MCG tablet Take 100 mcg by mouth daily before breakfast. 10/06/14   [provider]  loratadine (CLARITIN) 5 MG chewable tablet Chew 5 mg by mouth daily.    [provider]  losartan (COZAAR) 100 MG tablet Take 100 mg by mouth daily.  01/06/17 01/17/22  [provider]  Magnesium Oxide 500 MG TABS Take 500 mg by mouth daily.     [provider]  metoprolol succinate (TOPROL-XL) 50 MG 24 hr tablet Take 50 mg by mouth 2 (two) times daily.  06/11/12   [provider]  Multiple Vitamin (MULTI-VITAMINS) TABS Take 1 tablet by mouth daily.     [provider]  niacin (VITAMIN B3) 500 MG tablet Take 500 mg by mouth at bedtime.    [provider]  oxyCODONE-acetaminophen (PERCOCET/ROXICET) 5-325 MG tablet Take 1-2 tablets by mouth every 4 (four) hours as needed for moderate pain. 01/18/22   Tressie Stalker, MD  polyethylene glycol (MIRALAX / GLYCOLAX) 17 g packet Take 17 g by mouth daily as needed for moderate constipation.    [provider]  traMADol (ULTRAM) 50 MG tablet Take 50 mg by mouth in the morning, at noon, and at bedtime.    [provider]     Allergies  Benazepril, Hctz [hydrochlorothiazide], Hydralazine hcl, Fenofibrate, Repatha [evolocumab], Zetia [ezetimibe], Benadryl [diphenhydramine], and Omeprazole   Family History  History reviewed. No pertinent family history.   Physical Exam  Triage Vital Signs: ED Triage Vitals  Encounter Vitals Group     BP 12/28/22 0340 (!) 162/69     Systolic BP Percentile --      Diastolic BP Percentile --      Pulse Rate 12/28/22 0340 71     Resp 12/28/22 0340 15     Temp 12/28/22 0340 98.2 F (36.8 C)     Temp Source 12/28/22 0340 Oral     SpO2 12/28/22 0340 97 %      Weight 12/28/22 0337 170 lb (77.1 kg)     Height 12/28/22 0337 5\' 6"  (1.676 m)     Head Circumference --      Peak Flow --      Pain Score 12/28/22 0336 0     Pain Loc --      Pain Education --      Exclude from Growth Chart --     Updated Vital Signs: BP (!) 151/60   Pulse 62   Temp 98.2 F (36.8 C) (Oral)   Resp 18   Ht 5\' 6"  (1.676 m)   Wt 77.1 kg  SpO2 98%   BMI 27.44 kg/m    General: Awake, no distress.  CV:  RRR.  Good peripheral perfusion.  Resp:  Normal effort.  CTAB. Abd:  Nontender.  No distention.  Other:  No thyromegaly.  Bilateral calves are supple without tenderness.   ED Results / Procedures / Treatments  Labs (all labs ordered are listed, but only abnormal results are displayed) Labs Reviewed  CBC WITH DIFFERENTIAL/PLATELET - Abnormal; Notable for the following components:      Result Value   RBC 3.92 (*)    HCT 37.6 (*)    All other components within normal limits  COMPREHENSIVE METABOLIC PANEL - Abnormal; Notable for the following components:   Sodium 130 (*)    Glucose, Bld 112 (*)    Creatinine, Ser 1.37 (*)    GFR, Estimated 52 (*)    All other components within normal limits  TSH  T4, FREE  TROPONIN I (HIGH SENSITIVITY)  TROPONIN I (HIGH SENSITIVITY)     EKG  ED ECG REPORT I, Drayk Humbarger J, the attending physician, personally viewed and interpreted this ECG.   Date: 12/28/2022  EKG Time: 0339  Rate: 67  Rhythm: normal sinus rhythm  Axis: Normal  Intervals:none  ST&T Change: Nonspecific    RADIOLOGY None   Official radiology report(s): No results found.   PROCEDURES:  Critical Care performed: No  .1-3 Lead EKG Interpretation  Performed by: Irean Hong, MD Authorized by: Irean Hong, MD     Interpretation: normal     ECG rate:  65   ECG rate assessment: normal     Rhythm: sinus rhythm     Ectopy: none     Conduction: normal   Comments:     Patient placed on cardiac monitor to evaluate for  arrhythmias    MEDICATIONS ORDERED IN ED: Medications - No data to display   IMPRESSION / MDM / ASSESSMENT AND PLAN / ED COURSE  I reviewed the triage vital signs and the nursing notes.                             82 year old male presenting with palpitations. Differential diagnosis includes, but is not limited to, ACS, aortic dissection, pulmonary embolism, cardiac tamponade, pneumothorax, pneumonia, pericarditis, myocarditis, GI-related causes including esophagitis/gastritis, and musculoskeletal chest wall pain.   I personally reviewed patient's records and note a PCP annual physical on 12/05/2022.  Patient's presentation is most consistent with acute presentation with potential threat to life or bodily function.  The patient is on the cardiac monitor to evaluate for evidence of arrhythmia and/or significant heart rate changes.  Laboratory results demonstrate normal WBC 5.3, mild hyponatremia with sodium 130, stable creatinine from baseline, initial troponin negative.  Will add thyroid panel, repeat troponin.  Patient currently voices no complaints.  Will reassess.  Clinical Course as of 12/28/22 9528  Wed Dec 28, 2022  0559 Thyroid panel and repeat troponin unremarkable.  Patient voices no complaints.  Will discharge home with follow-up with his cardiologist.  Strict return precautions given.  Patient and spouse verbalized understanding and agree with plan of care. [JS]    Clinical Course User Index [JS] Irean Hong, MD     FINAL CLINICAL IMPRESSION(S) / ED DIAGNOSES   Final diagnoses:  Heart palpitations     Rx / DC Orders   ED Discharge Orders     None        Note:  This document was prepared using Dragon voice recognition software and may include unintentional dictation errors.   Irean Hong, MD 12/28/22 [phone]

## 2023-02-17 ENCOUNTER — Other Ambulatory Visit: Payer: Self-pay | Admitting: Neurosurgery

## 2023-02-17 DIAGNOSIS — M4316 Spondylolisthesis, lumbar region: Secondary | ICD-10-CM

## 2023-02-22 ENCOUNTER — Ambulatory Visit
Admission: RE | Admit: 2023-02-22 | Discharge: 2023-02-22 | Disposition: A | Discharge status: Home / Self Care | Payer: Medicare PPO | Source: Ambulatory Visit | Attending: Neurosurgery | Admitting: Neurosurgery

## 2023-02-22 ENCOUNTER — Other Ambulatory Visit: Payer: Self-pay | Admitting: Neurosurgery

## 2023-02-22 DIAGNOSIS — M4316 Spondylolisthesis, lumbar region: Secondary | ICD-10-CM | POA: Insufficient documentation

## 2023-08-07 ENCOUNTER — Other Ambulatory Visit (INDEPENDENT_AMBULATORY_CARE_PROVIDER_SITE_OTHER): Payer: Self-pay | Admitting: Vascular Surgery

## 2023-08-07 DIAGNOSIS — I6523 Occlusion and stenosis of bilateral carotid arteries: Secondary | ICD-10-CM

## 2023-08-08 ENCOUNTER — Ambulatory Visit (INDEPENDENT_AMBULATORY_CARE_PROVIDER_SITE_OTHER): Payer: Managed Care, Other (non HMO) | Admitting: Vascular Surgery

## 2023-08-08 ENCOUNTER — Ambulatory Visit (INDEPENDENT_AMBULATORY_CARE_PROVIDER_SITE_OTHER): Payer: Managed Care, Other (non HMO)

## 2023-08-08 ENCOUNTER — Encounter (INDEPENDENT_AMBULATORY_CARE_PROVIDER_SITE_OTHER): Payer: Self-pay | Admitting: Vascular Surgery

## 2023-08-08 VITALS — BP 139/68 | HR 67 | Resp 16 | Wt 174.4 lb

## 2023-08-08 DIAGNOSIS — N183 Chronic kidney disease, stage 3 unspecified: Secondary | ICD-10-CM

## 2023-08-08 DIAGNOSIS — I6523 Occlusion and stenosis of bilateral carotid arteries: Secondary | ICD-10-CM

## 2023-08-08 DIAGNOSIS — E7800 Pure hypercholesterolemia, unspecified: Secondary | ICD-10-CM | POA: Diagnosis not present

## 2023-08-08 DIAGNOSIS — I10 Essential (primary) hypertension: Secondary | ICD-10-CM | POA: Diagnosis not present

## 2023-08-08 NOTE — Progress Notes (Signed)
 MRN : [id number]  Douglas Phelps is a 83 y.o. (10-09-1940) male who presents with chief complaint of  Chief Complaint  Patient presents with   Follow-up    80yr carotid follow up  .  History of Present Illness: Patient returns in follow-up of his carotid disease.  He has had 2 back surgeries since his last visit, but no symptoms referable to his carotid arteries. Specifically, the patient denies amaurosis fugax, speech or swallowing difficulties, or arm or leg weakness or numbness.  Carotid duplex today shows stable 1 to 39% ICA stenosis bilaterally.  Current Outpatient Medications  Medication Sig Dispense Refill   amLODipine (NORVASC) 5 MG tablet Take 5 mg by mouth 2 (two) times daily.      Ascorbic Acid (VITAMIN C) 1000 MG tablet Take 1,000 mg by mouth daily.      aspirin EC 81 MG tablet Take 81 mg by mouth daily.     Calcium Carbonate-Vitamin D (CALCIUM-VITAMIN D3 PO) Take 1 tablet by mouth in the morning and at bedtime.     Camphor-Menthol-Methyl Sal (SALONPAS) 3.06-04-08 % PTCH Place 1 patch onto the skin daily as needed (pain).     docusate sodium (COLACE) 100 MG capsule Take 100 mg by mouth 2 (two) times daily.     esomeprazole (NEXIUM) 40 MG capsule Take 40 mg by mouth every other day.      famotidine (PEPCID) 20 MG tablet Take 20 mg by mouth every other day.     guaiFENesin (MUCINEX) 600 MG 12 hr tablet Take 600 mg by mouth at bedtime as needed (congestion).     levothyroxine (SYNTHROID) 100 MCG tablet Take 100 mcg by mouth daily before breakfast.     loratadine (CLARITIN) 5 MG chewable tablet Chew 5 mg by mouth daily.     Magnesium Oxide 500 MG TABS Take 500 mg by mouth daily.      metoprolol succinate (TOPROL-XL) 25 MG 24 hr tablet Take 50 mg by mouth 2 (two) times daily.     Multiple Vitamin (MULTI-VITAMINS) TABS Take 1 tablet by mouth daily.      niacin (VITAMIN B3) 500 MG tablet Take 500 mg by mouth at bedtime.     polyethylene glycol (MIRALAX / GLYCOLAX) 17 g packet  Take 17 g by mouth daily as needed for moderate constipation.     traMADol (ULTRAM) 50 MG tablet Take 50 mg by mouth in the morning, at noon, and at bedtime.     acetaminophen (TYLENOL) 500 MG tablet Take 1,000 mg by mouth at bedtime.     aspirin EC 81 MG tablet Take 81 mg by mouth daily.      cyanocobalamin (VITAMIN B12) 1000 MCG/ML injection Inject into the muscle every 30 (thirty) days. (Patient not taking: Reported on 08/08/2023)     cyclobenzaprine (FLEXERIL) 5 MG tablet Take 1 tablet (5 mg total) by mouth 3 (three) times daily as needed for muscle spasms. 30 tablet 0   doxazosin (CARDURA) 4 MG tablet Take 4 mg by mouth daily.     losartan (COZAAR) 100 MG tablet Take 50 mg by mouth daily.     oxyCODONE-acetaminophen (PERCOCET/ROXICET) 5-325 MG tablet Take 1-2 tablets by mouth every 4 (four) hours as needed for moderate pain. 30 tablet 0   No current facility-administered medications for this visit.    Past Medical History:  Diagnosis Date   Actinic keratosis    Arthritis    Cancer (HCC) 2015   prostate  GERD (gastroesophageal reflux disease)    High cholesterol    History of hiatal hernia    Hx of CABG    Hypertension    Hypothyroidism    PONV (postoperative nausea and vomiting)    Premature ventricular contractions    Renal disorder    CKD stg 3   SI (sacroiliac) joint dysfunction    Thyroid disease     Past Surgical History:  Procedure Laterality Date   APPENDECTOMY     CARDIAC SURGERY     CABG x 3  06/2000   CORONARY ARTERY BYPASS GRAFT     HERNIA REPAIR Right 2015   groin   PROSTATE SURGERY  10/2013     Social History   Tobacco Use   Smoking status: Never   Smokeless tobacco: Never  Vaping Use   Vaping status: Never Used  Substance Use Topics   Alcohol use: No   Drug use: No      Family History  Problem Relation Age of Onset   Heart disease Mother    Emphysema Father      Allergies  Allergen Reactions   Benazepril Cough   Hctz  [Hydrochlorothiazide] Other (See Comments)    Depleted electrolytes    Hydralazine Hcl Other (See Comments)    Kidney Disorder    Fenofibrate     Muscle soreness   Repatha [Evolocumab]     Weakness, shakiness, abnormal dreams, GI Upset   Zetia [Ezetimibe]     Creased back and leg pain   Benadryl [Diphenhydramine] Anxiety   Omeprazole Other (See Comments)    headache     REVIEW OF SYSTEMS (Negative unless checked)  Constitutional: [] Weight loss  [] Fever  [] Chills Cardiac: [] Chest pain   [] Chest pressure   [] Palpitations   [] Shortness of breath when laying flat   [] Shortness of breath at rest   [] Shortness of breath with exertion. Vascular:  [] Pain in legs with walking   [] Pain in legs at rest   [] Pain in legs when laying flat   [] Claudication   [] Pain in feet when walking  [] Pain in feet at rest  [] Pain in feet when laying flat   [] History of DVT   [] Phlebitis   [] Swelling in legs   [] Varicose veins   [] Non-healing ulcers Pulmonary:   [] Uses home oxygen   [] Productive cough   [] Hemoptysis   [] Wheeze  [] COPD   [] Asthma Neurologic:  [] Dizziness  [] Blackouts   [] Seizures   [] History of stroke   [] History of TIA  [] Aphasia   [] Temporary blindness   [] Dysphagia   [] Weakness or numbness in arms   [] Weakness or numbness in legs Musculoskeletal:  [x] Arthritis   [] Joint swelling   [] Joint pain   [x] Low back pain Hematologic:  [] Easy bruising  [] Easy bleeding   [] Hypercoagulable state   [] Anemic  [] Hepatitis Gastrointestinal:  [] Blood in stool   [] Vomiting blood  [] Gastroesophageal reflux/heartburn   [] Difficulty swallowing. Genitourinary:  [] Chronic kidney disease   [] Difficult urination  [] Frequent urination  [] Burning with urination   [] Blood in urine Skin:  [] Rashes   [] Ulcers   [] Wounds Psychological:  [] History of anxiety   []  History of major depression.  Physical Examination  Vitals:   08/08/23 1100  BP: 139/68  Pulse: 67  Resp: 16  Weight: 174 lb 6.4 oz (79.1 kg)   Body mass  index is 28.15 kg/m. Gen:  WD/WN, NAD. Appears younger than stated age. Head: [place]/AT, No temporalis wasting. Ear/Nose/Throat: Hearing grossly intact, nares w/o erythema or  drainage, trachea midline Eyes: Conjunctiva clear. Sclera non-icteric Neck: Supple.  No bruit  Pulmonary:  Good air movement, equal and clear to auscultation bilaterally.  Cardiac: RRR, No JVD Vascular:  Vessel Right Left  Radial Palpable Palpable           Musculoskeletal: M/S 5/5 throughout.  No deformity or atrophy. No edema. Neurologic: CN 2-12 intact. Sensation grossly intact in extremities.  Symmetrical.  Speech is fluent. Motor exam as listed above. Psychiatric: Judgment intact, Mood & affect appropriate for pt's clinical situation. Dermatologic: No rashes or ulcers noted.  No cellulitis or open wounds.     CBC Lab Results  Component Value Date   WBC 5.3 12/28/2022   HGB 13.1 12/28/2022   HCT 37.6 (L) 12/28/2022   MCV 95.9 12/28/2022   PLT 170 12/28/2022    BMET    Component Value Date/Time   NA 130 (L) 12/28/2022 0342   NA 134 (L) 01/26/2013 1511   K 4.1 12/28/2022 0342   K 4.8 01/26/2013 1511   CL 99 12/28/2022 0342   CL 103 01/26/2013 1511   CO2 24 12/28/2022 0342   CO2 25 01/26/2013 1511   GLUCOSE 112 (H) 12/28/2022 0342   GLUCOSE 133 (H) 01/26/2013 1511   BUN 22 12/28/2022 0342   BUN 30 (H) 01/26/2013 1511   CREATININE 1.37 (H) 12/28/2022 0342   CREATININE 1.52 (H) 01/26/2013 1511   CALCIUM 9.0 12/28/2022 0342   CALCIUM 9.3 01/26/2013 1511   GFRNONAA 52 (L) 12/28/2022 0342   GFRNONAA 45 (L) 01/26/2013 1511   GFRAA 56 (L) 05/28/2019 1750   GFRAA 53 (L) 01/26/2013 1511   CrCl cannot be calculated (Patient's most recent lab result is older than the maximum 21 days allowed.).  COAG No results found for: "INR", "PROTIME"  Radiology No results found.   Assessment/Plan Bilateral carotid artery stenosis Duplex today shows stable 1 to 39% ICA stenosis bilaterally on the upper  end of that range on each side, but no progression from previous study last year.  No role for intervention at this degree of stenosis.  Continue to follow on an annual basis.   High cholesterol lipid control important in reducing the progression of atherosclerotic disease.      Hypertension blood pressure control important in reducing the progression of atherosclerotic disease. On appropriate oral medications.   Festus Barren, MD  08/08/2023 11:31 AM    This note was created with Dragon medical transcription system.  Any errors from dictation are purely unintentional

## 2023-08-09 ENCOUNTER — Other Ambulatory Visit: Payer: Self-pay | Admitting: Neurosurgery

## 2023-08-09 DIAGNOSIS — M4316 Spondylolisthesis, lumbar region: Secondary | ICD-10-CM

## 2023-08-24 ENCOUNTER — Ambulatory Visit

## 2023-08-24 ENCOUNTER — Ambulatory Visit
Admission: RE | Admit: 2023-08-24 | Discharge: 2023-08-24 | Disposition: A | Discharge status: Home / Self Care | Source: Ambulatory Visit | Attending: Neurosurgery | Admitting: Neurosurgery

## 2023-08-24 DIAGNOSIS — M4316 Spondylolisthesis, lumbar region: Secondary | ICD-10-CM | POA: Insufficient documentation

## 2023-09-07 ENCOUNTER — Ambulatory Visit: Payer: Medicare PPO | Admitting: Dermatology

## 2023-09-19 ENCOUNTER — Encounter: Payer: Self-pay | Admitting: Dermatology

## 2023-09-19 ENCOUNTER — Ambulatory Visit (INDEPENDENT_AMBULATORY_CARE_PROVIDER_SITE_OTHER): Admitting: Dermatology

## 2023-09-19 DIAGNOSIS — Z1283 Encounter for screening for malignant neoplasm of skin: Secondary | ICD-10-CM

## 2023-09-19 DIAGNOSIS — L820 Inflamed seborrheic keratosis: Secondary | ICD-10-CM | POA: Diagnosis not present

## 2023-09-19 DIAGNOSIS — L578 Other skin changes due to chronic exposure to nonionizing radiation: Secondary | ICD-10-CM

## 2023-09-19 DIAGNOSIS — L814 Other melanin hyperpigmentation: Secondary | ICD-10-CM

## 2023-09-19 DIAGNOSIS — L918 Other hypertrophic disorders of the skin: Secondary | ICD-10-CM

## 2023-09-19 DIAGNOSIS — D1801 Hemangioma of skin and subcutaneous tissue: Secondary | ICD-10-CM

## 2023-09-19 DIAGNOSIS — D229 Melanocytic nevi, unspecified: Secondary | ICD-10-CM

## 2023-09-19 DIAGNOSIS — W908XXA Exposure to other nonionizing radiation, initial encounter: Secondary | ICD-10-CM | POA: Diagnosis not present

## 2023-09-19 DIAGNOSIS — L821 Other seborrheic keratosis: Secondary | ICD-10-CM

## 2023-09-19 DIAGNOSIS — L570 Actinic keratosis: Secondary | ICD-10-CM | POA: Diagnosis not present

## 2023-09-19 NOTE — Patient Instructions (Signed)

## 2023-09-19 NOTE — Progress Notes (Signed)
 Follow-Up Visit   Subjective  Douglas Phelps is a 83 y.o. male who presents for the following: Skin Cancer Screening and Full Body Skin Exam  The patient presents for Total-Body Skin Exam (TBSE) for skin cancer screening and mole check. The patient has spots, moles and lesions to be evaluated, some may be new or changing and the patient may have concern these could be cancer.  The following portions of the chart were reviewed this encounter and updated as appropriate: medications, allergies, medical history  Review of Systems:  No other skin or systemic complaints except as noted in HPI or Assessment and Plan.  Objective  Well appearing patient in no apparent distress; mood and affect are within normal limits.  A full examination was performed including scalp, head, eyes, ears, nose, lips, neck, chest, axillae, abdomen, back, buttocks, bilateral upper extremities, bilateral lower extremities, hands, feet, fingers, toes, fingernails, and toenails. All findings within normal limits unless otherwise noted below.   Relevant physical exam findings are noted in the Assessment and Plan.  L sideburn x 1 Erythematous thin papules/macules with gritty scale.  R mastoid x 1 Erythematous stuck-on, waxy papule or plaque  Assessment & Plan   SKIN CANCER SCREENING PERFORMED TODAY.  ACTINIC DAMAGE - Chronic condition, secondary to cumulative UV/sun exposure - diffuse scaly erythematous macules with underlying dyspigmentation - Recommend daily broad spectrum sunscreen SPF 30+ to sun-exposed areas, reapply every 2 hours as needed.  - Staying in the shade or wearing long sleeves, sun glasses (UVA+UVB protection) and wide brim hats (4-inch brim around the entire circumference of the hat) are also recommended for sun protection.  - Call for new or changing lesions.  LENTIGINES, SEBORRHEIC KERATOSES, HEMANGIOMAS - Benign normal skin lesions - Benign-appearing - Call for any changes  MELANOCYTIC  NEVI - Tan-brown and/or pink-flesh-colored symmetric macules and papules - Benign appearing on exam today - Observation - Call clinic for new or changing moles - Recommend daily use of broad spectrum spf 30+ sunscreen to sun-exposed areas.   AK (ACTINIC KERATOSIS) L sideburn x 1 Actinic keratoses are precancerous spots that appear secondary to cumulative UV radiation exposure/sun exposure over time. They are chronic with expected duration over 1 year. A portion of actinic keratoses will progress to squamous cell carcinoma of the skin. It is not possible to reliably predict which spots will progress to skin cancer and so treatment is recommended to prevent development of skin cancer.  Recommend daily broad spectrum sunscreen SPF 30+ to sun-exposed areas, reapply every 2 hours as needed.  Recommend staying in the shade or wearing long sleeves, sun glasses (UVA+UVB protection) and wide brim hats (4-inch brim around the entire circumference of the hat). Call for new or changing lesions.  Destruction of lesion - L sideburn x 1 Complexity: simple   Destruction method: cryotherapy   Informed consent: discussed and consent obtained   Timeout:  patient name, date of birth, surgical site, and procedure verified Lesion destroyed using liquid nitrogen: Yes   Region frozen until ice ball extended beyond lesion: Yes   Outcome: patient tolerated procedure well with no complications   Post-procedure details: wound care instructions given   INFLAMED SEBORRHEIC KERATOSIS R mastoid x 1 Symptomatic, irritating, patient would like treated.  Destruction of lesion - R mastoid x 1 Complexity: simple   Destruction method: cryotherapy   Informed consent: discussed and consent obtained   Timeout:  patient name, date of birth, surgical site, and procedure verified Lesion destroyed using  liquid nitrogen: Yes   Region frozen until ice ball extended beyond lesion: Yes   Outcome: patient tolerated procedure well  with no complications   Post-procedure details: wound care instructions given    Acrochordons (Skin Tags) - axillary - Fleshy, skin-colored pedunculated papules - Benign appearing.  - Observe. - If desired, they can be removed with an in office procedure that is not covered by insurance. - Please call the clinic if you notice any new or changing lesions.   Return in about 1 year (around 09/18/2024) for TBSE - hx AK, ISK.  I, Mara [place]in[place], CMA, am acting as scribe for Celine Collard, MD .  Documentation: I have reviewed the above documentation for accuracy and completeness, and I agree with the above.  Celine Collard, MD

## 2023-10-17 ENCOUNTER — Encounter (INDEPENDENT_AMBULATORY_CARE_PROVIDER_SITE_OTHER): Payer: Self-pay

## 2023-12-28 ENCOUNTER — Other Ambulatory Visit: Payer: Self-pay | Admitting: Neurosurgery

## 2024-01-05 NOTE — Progress Notes (Signed)
 Douglas Phelps is a  83 y.o. male who presents for  CHIEF COMPLAINT Chief Complaint  Patient presents with  . Annual Exam    Subjective: History of Present Illness  Pt in NAD. Here for yearly eval. HTN stable on meds. Has HLD not on statin due to myopathy, prediabetes not on meds and thyroid  dz on Synthroid . Also with CKD and chronic back pain. Weight stable. Having back surgery in 12 days. No fever. Denies CP or SOB. some palpitations. No change in bowels or bladder.    Past Medical History:  Diagnosis Date  . Abdominal hernia Feb 2015  . Abnormal barium swallow 05/03/2016  . Arrhythmia 1965 & 02/04/16   See 9/7 ECG results just posted  . Arthritis 2000  . BPH (benign prostatic hypertrophy)   . CAD (coronary artery disease)   . Cardiac murmur   . Carotid artery occlusion 2008  . Cataract cortical, senile 2005  . Chronic kidney disease   . GERD (gastroesophageal reflux disease)   . Hyperlipidemia   . Hypertension   . Migraine headache Silent  . Mitral regurgitation   . Prostate cancer (CMS/HHS-HCC)   . PSA elevation   . Thyroid  disease   . TIA (transient ischemic attack)    Patient Active Problem List  Diagnosis  . ASCVD (arteriosclerotic cardiovascular disease)  . Hypothyroidism  . Hyperlipemia, mixed  . Moderate mitral insufficiency  . Muscle strain of thigh, left, initial encounter  . Benign essential hypertension  . CKD (chronic kidney disease) stage 3, GFR 30-59 ml/min (CMS/HHS-HCC)  . Gastroesophageal reflux disease with hiatal hernia  . Abnormal barium swallow  . Complex tear of medial meniscus of left knee as current injury  . Bilateral carotid artery stenosis  . Degenerative disc disease, lumbar  . Statin myopathy  . Prediabetes    Past Surgical History:  Procedure Laterality Date  . SIGMOIDOSCOPY FLEXIBLE  04/01/1999  . CARDIAC CATHETERIZATION  Feb 2002  . EGD  03/27/2012  . COLONOSCOPY  2014  . UPPER GASTROINTESTINAL ENDOSCOPY  2014  . COLON  SURGERY  Feb 2015   [facility]  . PROSTATE SURGERY  October 29, 2013  . APPENDECTOMY    . COLONOSCOPY  12/25/2001, 03/27/2012  . CORONARY ARTERY BYPASS GRAFT    . HEMORRHOIDECTOMY BY SIMPLE LIGATION    . HERNIA REPAIR    . SIGMOIDOSCOPY  1990s  . triple by-pass       Current Outpatient Medications:  .  acetaminophen  (TYLENOL ) 500 mg capsule, Take 1,000 mg by mouth 4 (four) times daily, Disp: , Rfl:  .  amLODIPine  (NORVASC ) 5 MG tablet, TAKE 1 TABLET BY MOUTH TWICE A DAY, Disp: 180 tablet, Rfl: 3 .  ascorbic acid, vitamin C, (VITAMIN C) 1000 MG tablet, Take 1 tablet by mouth once daily, Disp: , Rfl:  .  aspirin  81 MG EC tablet, Take 81 mg by mouth once daily., Disp: , Rfl:  .  calcium carbonate 600 mg calcium (1,500 mg) Tab tablet, Take 1 tablet by mouth 2 (two) times daily, Disp: , Rfl:  .  DOCUSATE SODIUM  (STOOL SOFTENER ORAL), Take by mouth 2 (two) times daily as needed., Disp: , Rfl:  .  doxazosin  (CARDURA ) 4 MG tablet, TAKE 1 TABLET BY MOUTH EVERY DAY IN THE EVENING, Disp: 90 tablet, Rfl: 1 .  esomeprazole (NEXIUM) 40 MG DR capsule, TAKE 1 CAPSULE BY MOUTH EVERY DAY, Disp: 90 capsule, Rfl: 1 .  FA/mv,Ca,iron,min/lycopene/lut (MULTIVITAL ORAL), Take 1 tablet by mouth  once daily   , Disp: , Rfl:  .  famotidine (PEPCID) 20 MG tablet, Take 20 mg by mouth every other day, Disp: , Rfl:  .  levothyroxine  (SYNTHROID ) 100 MCG tablet, TAKE 1 TABLET BY MOUTH EVERY MORNING ON EMPTY STOMACH 30-60MIN BEFORE BREAKFAST W/ WATER, Disp: 90 tablet, Rfl: 1 .  loratadine  (CLARITIN ) 10 mg tablet, Take 1 tablet by mouth once daily as needed, Disp: , Rfl:  .  losartan  (COZAAR ) 50 MG tablet, TAKE 1 TABLET BY MOUTH EVERY DAY, Disp: 90 tablet, Rfl: 3 .  metoprolol  SUCCinate (TOPROL -XL) 25 MG XL tablet, TAKE 1 TABLET BY MOUTH TWICE A DAY, Disp: 180 tablet, Rfl: 3 .  niacin  500 MG tablet, Take 500 mg by mouth daily with breakfast, Disp: , Rfl:  .  traMADoL  (ULTRAM ) 50 mg tablet, TAKE 1 TABLET (50 MG TOTAL) BY  MOUTH EVERY 8 (EIGHT) HOURS AS NEEDED FOR PAIN., Disp: 90 tablet, Rfl: 0  Fenofibrate, Omeprazole, Benadryl [diphenhydramine hcl], Benazepril hcl, Gemfibrozil, Hctz/reserpine/hydralazine [hydralazine-reserpin-hcthiazid], Hydralazine hcl, Hydrochlorothiazide, Lotensin [benazepril], Repatha pushtronex [evolocumab], and Zetia [ezetimibe]  Social History   Socioeconomic History  . Marital status: Married  Tobacco Use  . Smoking status: Never  . Smokeless tobacco: Never  Vaping Use  . Vaping status: Never Used  Substance and Sexual Activity  . Alcohol use: Not Currently  . Drug use: No  . Sexual activity: Not Currently   Social Drivers of Health   Financial Resource Strain: Low Risk  (01/02/2024)   Overall Financial Resource Strain (CARDIA)   . Difficulty of Paying Living Expenses: Not hard at all  Food Insecurity: No Food Insecurity (01/02/2024)   Hunger Vital Sign   . Worried About Programme researcher, broadcasting/film/video in the Last Year: Never true   . Ran Out of Food in the Last Year: Never true  Transportation Needs: No Transportation Needs (01/02/2024)   PRAPARE - Transportation   . Lack of Transportation (Medical): No   . Lack of Transportation (Non-Medical): No  Housing Stability: Low Risk  (01/02/2024)   Housing Stability Vital Sign   . Unable to Pay for Housing in the Last Year: No   . Number of Times Moved in the Last Year: 0   . Homeless in the Last Year: No    Family History  Problem Relation Name Age of Onset  . Coronary Artery Disease (Blocked arteries around heart) Mother Mardeen   . Osteoporosis (Thinning of bones) Mother Mardeen   . Myocardial Infarction (Heart attack) Mother Mardeen   . Heart disease Mother Mardeen        Enlarged heart  . Kidney disease Mother Mardeen   . COPD Father    . Emphysema Father    . Hepatitis C Brother    . Diabetes type II Other    . Heart disease Other    . High blood pressure (Hypertension) Other    . Breast cancer Other         grandmother  .  Cirrhosis Brother Octaviano        Liver transplant  . Liver disease Brother Octaviano   . Parkinsonism Brother Octaviano   . Breast cancer Paternal Grandmother Lucy     A comprehensive ROS was negative except for HPI  PE: BP (!) 154/80   Pulse 68   Ht 167.6 cm (5' 6)   Wt 77.6 kg (171 lb)   SpO2 98%   BMI 27.60 kg/m  General. Alert oriented x3  Eyes. Sclera and conjunctiva  clear; pupils equal round and reactive to light and accommodation; extraocular movements intact Nose. Mucosa healthy without drainage or ulceration Oropharynx. No suspicious lesions Neck. No swelling, masses, stiffness, pain, limited movement, carotid pulses normal bilaterally, thyroid  normal size, no masses palpated.  No bruits Lungs. Respirations unlabored; clear to auscultation bilaterally Back. No spinal deformity Cardiovascular. Heart regular rate and rhythm without murmurs, gallops, or rubs Abdomen. Soft; non tender; non distended; normoactive bowel sounds; no masses or organomegaly Lymph Nodes. No significant cervical, supraclavicular, axillary or inguinal lymphadenopathy noted Musculoskeletal. No deformities; no active joint inflammation Extremities. Normal, no edema Pulses. Dorsalis pedis palpable and symmetric bilaterally Neurologic. Alert and oriented; speech intact; face symmetrical; moves all extremities well  Appointment on 09/07/2023  Component Date Value Ref Range Status  . WBC (White Blood Cell Count) 09/07/2023 4.4  4.1 - 10.2 10^3/uL Final  . RBC (Red Blood Cell Count) 09/07/2023 3.95 (L)  4.69 - 6.13 10^6/uL Final  . Hemoglobin 09/07/2023 12.8 (L)  14.1 - 18.1 gm/dL Final  . Hematocrit 95/89/7974 38.0 (L)  40.0 - 52.0 % Final  . MCV (Mean Corpuscular Volume) 09/07/2023 96.2  80.0 - 100.0 fl Final  . MCH (Mean Corpuscular Hemoglobin) 09/07/2023 32.4 (H)  27.0 - 31.2 pg Final  . MCHC (Mean Corpuscular Hemoglobin * 09/07/2023 33.7  32.0 - 36.0 gm/dL Final  . Platelet Count 09/07/2023 161  150 - 450 10^3/uL  Final  . RDW-CV (Red Cell Distribution Widt* 09/07/2023 13.0  11.6 - 14.8 % Final  . MPV (Mean Platelet Volume) 09/07/2023 9.4  9.4 - 12.4 fl Final  . Neutrophils 09/07/2023 2.66  1.50 - 7.80 10^3/uL Final  . Lymphocytes 09/07/2023 0.97 (L)  1.00 - 3.60 10^3/uL Final  . Monocytes 09/07/2023 0.54  0.00 - 1.50 10^3/uL Final  . Eosinophils 09/07/2023 0.16  0.00 - 0.55 10^3/uL Final  . Basophils 09/07/2023 0.01  0.00 - 0.09 10^3/uL Final  . Neutrophil % 09/07/2023 61.2  32.0 - 70.0 % Final  . Lymphocyte % 09/07/2023 22.3  10.0 - 50.0 % Final  . Monocyte % 09/07/2023 12.4  4.0 - 13.0 % Final  . Eosinophil % 09/07/2023 3.7  1.0 - 5.0 % Final  . Basophil% 09/07/2023 0.2  0.0 - 2.0 % Final  . Immature Granulocyte % 09/07/2023 0.2  <=0.7 % Final  . Immature Granulocyte Count 09/07/2023 0.01  <=0.06 10^3/L Final  . Glucose 09/07/2023 103  70 - 110 mg/dL Final  . Sodium 95/89/7974 136  136 - 145 mmol/L Final  . Potassium 09/07/2023 4.6  3.6 - 5.1 mmol/L Final  . Chloride 09/07/2023 101  97 - 109 mmol/L Final  . Carbon Dioxide (CO2) 09/07/2023 29.0  22.0 - 32.0 mmol/L Final  . Urea Nitrogen (BUN) 09/07/2023 19  7 - 25 mg/dL Final  . Creatinine 95/89/7974 1.4 (H)  0.7 - 1.3 mg/dL Final  . Glomerular Filtration Rate (eGFR) 09/07/2023 50 (L)  >60 mL/min/1.73sq m Final  . Calcium 09/07/2023 9.2  8.7 - 10.3 mg/dL Final  . AST  95/89/7974 17  8 - 39 U/L Final  . ALT  09/07/2023 13  6 - 57 U/L Final  . Alk Phos (alkaline Phosphatase) 09/07/2023 58  34 - 104 U/L Final  . Albumin 09/07/2023 4.2  3.5 - 4.8 g/dL Final  . Bilirubin, Total 09/07/2023 0.3  0.3 - 1.2 mg/dL Final  . Protein, Total 09/07/2023 6.5  6.1 - 7.9 g/dL Final  . A/G Ratio 95/89/7974 1.8  1.0 - 5.0  gm/dL Final  . Cholesterol, Total 09/07/2023 199  100 - 200 mg/dL Final  . Triglyceride 95/89/7974 270 (H)  35 - 199 mg/dL Final  . HDL (High Density Lipoprotein) Cho* 09/07/2023 35.2  29.0 - 71.0 mg/dL Final  . LDL Calculated 09/07/2023 889   0 - 130 mg/dL Final  . VLDL Cholesterol 09/07/2023 54  mg/dL Final  . Cholesterol/HDL Ratio 09/07/2023 5.7   Final  . Thyroid  Stimulating Hormone (TSH) 09/07/2023 3.626  0.450-5.330 uIU/ml uIU/mL Final  . Color 09/07/2023 Light Yellow  Colorless, Straw, Light Yellow, Yellow, Dark Yellow Final  . Clarity 09/07/2023 Clear  Clear Final  . Specific Gravity 09/07/2023 1.015  1.005 - 1.030 Final  . pH, Urine 09/07/2023 7.0  5.0 - 8.0 Final  . Protein, Urinalysis 09/07/2023 Negative  Negative mg/dL Final  . Glucose, Urinalysis 09/07/2023 Negative  Negative mg/dL Final  . Ketones, Urinalysis 09/07/2023 Negative  Negative mg/dL Final  . Blood, Urinalysis 09/07/2023 Negative  Negative Final  . Nitrite, Urinalysis 09/07/2023 Negative  Negative Final  . Leukocyte Esterase, Urinalysis 09/07/2023 Negative  Negative Final  . Bilirubin, Urinalysis 09/07/2023 Negative  Negative Final  . Urobilinogen, Urinalysis 09/07/2023 0.2  0.2 - 1.0 mg/dL Final  . WBC, UA 95/89/7974 1  <=5 /hpf Final  . Red Blood Cells, Urinalysis 09/07/2023 <1  <=3 /hpf Final  . Bacteria, Urinalysis 09/07/2023 0-5  0 - 5 /hpf Final  . Squamous Epithelial Cells, Urinaly* 09/07/2023 0  /hpf Final  . Hemoglobin A1C 09/07/2023 6.2 (H)  4.2 - 5.6 % Final  . Average Blood Glucose (Calc) 09/07/2023 131  mg/dL Final  . PSA (Prostate Specific Antigen), T* 09/07/2023 <0.01 (L)  0.10 - 4.00 ng/mL Final  . Hemoccult ICT 09/18/2023 Negative  Negative Final  . Hemoccult ICT 09/18/2023 Negative  Negative Final  Office Visit on 07/17/2023  Component Date Value Ref Range Status  . Glucose 07/17/2023 130 (H)  70 - 110 mg/dL Final  . Sodium 97/82/7974 133 (L)  136 - 145 mmol/L Final  . Potassium 07/17/2023 4.6  3.6 - 5.1 mmol/L Final  . Chloride 07/17/2023 98  97 - 109 mmol/L Final  . Carbon Dioxide (CO2) 07/17/2023 23.9  22.0 - 32.0 mmol/L Final  . Urea Nitrogen (BUN) 07/17/2023 24  7 - 25 mg/dL Final  . Creatinine 97/82/7974 1.5 (H)  0.7 - 1.3  mg/dL Final  . Glomerular Filtration Rate (eGFR) 07/17/2023 46 (L)  >60 mL/min/1.73sq m Final  . Calcium 07/17/2023 9.4  8.7 - 10.3 mg/dL Final  . AST  97/82/7974 15  8 - 39 U/L Final  . ALT  07/17/2023 13  6 - 57 U/L Final  . Alk Phos (alkaline Phosphatase) 07/17/2023 60  34 - 104 U/L Final  . Albumin 07/17/2023 4.2  3.5 - 4.8 g/dL Final  . Bilirubin, Total 07/17/2023 0.3  0.3 - 1.2 mg/dL Final  . Protein, Total 07/17/2023 6.5  6.1 - 7.9 g/dL Final  . A/G Ratio 97/82/7974 1.8  1.0 - 5.0 gm/dL Final  . WBC (White Blood Cell Count) 07/17/2023 4.9  4.1 - 10.2 10^3/uL Final  . RBC (Red Blood Cell Count) 07/17/2023 3.72 (L)  4.69 - 6.13 10^6/uL Final  . Hemoglobin 07/17/2023 12.3 (L)  14.1 - 18.1 gm/dL Final  . Hematocrit 97/82/7974 36.1 (L)  40.0 - 52.0 % Final  . MCV (Mean Corpuscular Volume) 07/17/2023 97.0  80.0 - 100.0 fl Final  . MCH (Mean Corpuscular Hemoglobin) 07/17/2023 33.1 (H)  27.0 -  31.2 pg Final  . MCHC (Mean Corpuscular Hemoglobin * 07/17/2023 34.1  32.0 - 36.0 gm/dL Final  . Platelet Count 07/17/2023 164  150 - 450 10^3/uL Final  . RDW-CV (Red Cell Distribution Widt* 07/17/2023 12.9  11.6 - 14.8 % Final  . MPV (Mean Platelet Volume) 07/17/2023 9.3 (L)  9.4 - 12.4 fl Final  . Neutrophils 07/17/2023 3.35  1.50 - 7.80 10^3/uL Final  . Lymphocytes 07/17/2023 0.88 (L)  1.00 - 3.60 10^3/uL Final  . Monocytes 07/17/2023 0.57  0.00 - 1.50 10^3/uL Final  . Eosinophils 07/17/2023 0.07  0.00 - 0.55 10^3/uL Final  . Basophils 07/17/2023 0.02  0.00 - 0.09 10^3/uL Final  . Neutrophil % 07/17/2023 68.4  32.0 - 70.0 % Final  . Lymphocyte % 07/17/2023 18.0  10.0 - 50.0 % Final  . Monocyte % 07/17/2023 11.6  4.0 - 13.0 % Final  . Eosinophil % 07/17/2023 1.4  1.0 - 5.0 % Final  . Basophil% 07/17/2023 0.4  0.0 - 2.0 % Final  . Immature Granulocyte % 07/17/2023 0.2  <=0.7 % Final  . Immature Granulocyte Count 07/17/2023 0.01  <=0.06 10^3/L Final  . Thyroid  Stimulating Hormone (TSH)  07/17/2023 2.661  0.450-5.330 uIU/ml uIU/mL Final  . Thyroxine, Free (FT4) 07/17/2023 0.91  0.66 - 1.14 ng/dL Final  . Vent Rate (bpm) 07/17/2023 75   Final  . PR Interval (msec) 07/17/2023 134   Final  . QRS Interval (msec) 07/17/2023 86   Final  . QT Interval (msec) 07/17/2023 372   Final  . QTc (msec) 07/17/2023 415   Final  Office Visit on 05/18/2023  Component Date Value Ref Range Status  . WBC (White Blood Cell Count) 05/18/2023 5.2  4.1 - 10.2 10^3/uL Final  . RBC (Red Blood Cell Count) 05/18/2023 3.72 (L)  4.69 - 6.13 10^6/uL Final  . Hemoglobin 05/18/2023 12.4 (L)  14.1 - 18.1 gm/dL Final  . Hematocrit 87/80/7975 36.0 (L)  40.0 - 52.0 % Final  . MCV (Mean Corpuscular Volume) 05/18/2023 96.8  80.0 - 100.0 fl Final  . MCH (Mean Corpuscular Hemoglobin) 05/18/2023 33.3 (H)  27.0 - 31.2 pg Final  . MCHC (Mean Corpuscular Hemoglobin * 05/18/2023 34.4  32.0 - 36.0 gm/dL Final  . Platelet Count 05/18/2023 174  150 - 450 10^3/uL Final  . RDW-CV (Red Cell Distribution Widt* 05/18/2023 13.3  11.6 - 14.8 % Final  . MPV (Mean Platelet Volume) 05/18/2023 9.2 (L)  9.4 - 12.4 fl Final  . Neutrophils 05/18/2023 3.52  1.50 - 7.80 10^3/uL Final  . Lymphocytes 05/18/2023 0.97 (L)  1.00 - 3.60 10^3/uL Final  . Monocytes 05/18/2023 0.64  0.00 - 1.50 10^3/uL Final  . Eosinophils 05/18/2023 0.08  0.00 - 0.55 10^3/uL Final  . Basophils 05/18/2023 0.01  0.00 - 0.09 10^3/uL Final  . Neutrophil % 05/18/2023 67.2  32.0 - 70.0 % Final  . Lymphocyte % 05/18/2023 18.5  10.0 - 50.0 % Final  . Monocyte % 05/18/2023 12.2  4.0 - 13.0 % Final  . Eosinophil % 05/18/2023 1.5  1.0 - 5.0 % Final  . Basophil% 05/18/2023 0.2  0.0 - 2.0 % Final  . Immature Granulocyte % 05/18/2023 0.4  <=0.7 % Final  . Immature Granulocyte Count 05/18/2023 0.02  <=0.06 10^3/L Final  . Glucose 05/18/2023 118 (H)  70 - 110 mg/dL Final  . Sodium 87/80/7975 134 (L)  136 - 145 mmol/L Final  . Potassium 05/18/2023 4.5  3.6 - 5.1 mmol/L  Final  .  Chloride 05/18/2023 100  97 - 109 mmol/L Final  . Carbon Dioxide (CO2) 05/18/2023 27.7  22.0 - 32.0 mmol/L Final  . Urea Nitrogen (BUN) 05/18/2023 23  7 - 25 mg/dL Final  . Creatinine 87/80/7975 1.4 (H)  0.7 - 1.3 mg/dL Final  . Glomerular Filtration Rate (eGFR) 05/18/2023 50 (L)  >60 mL/min/1.73sq m Final  . Calcium 05/18/2023 9.4  8.7 - 10.3 mg/dL Final  . AST  87/80/7975 16  8 - 39 U/L Final  . ALT  05/18/2023 14  6 - 57 U/L Final  . Alk Phos (alkaline Phosphatase) 05/18/2023 61  34 - 104 U/L Final  . Albumin 05/18/2023 4.4  3.5 - 4.8 g/dL Final  . Bilirubin, Total 05/18/2023 0.2 (L)  0.3 - 1.2 mg/dL Final  . Protein, Total 05/18/2023 6.6  6.1 - 7.9 g/dL Final  . A/G Ratio 87/80/7975 2.0  1.0 - 5.0 gm/dL Final  Office Visit on 04/06/2023  Component Date Value Ref Range Status  . WBC (White Blood Cell Count) 04/06/2023 5.2  4.1 - 10.2 10^3/uL Final  . RBC (Red Blood Cell Count) 04/06/2023 3.87 (L)  4.69 - 6.13 10^6/uL Final  . Hemoglobin 04/06/2023 12.9 (L)  14.1 - 18.1 gm/dL Final  . Hematocrit 88/92/7975 37.3 (L)  40.0 - 52.0 % Final  . MCV (Mean Corpuscular Volume) 04/06/2023 96.4  80.0 - 100.0 fl Final  . MCH (Mean Corpuscular Hemoglobin) 04/06/2023 33.3 (H)  27.0 - 31.2 pg Final  . MCHC (Mean Corpuscular Hemoglobin * 04/06/2023 34.6  32.0 - 36.0 gm/dL Final  . Platelet Count 04/06/2023 164  150 - 450 10^3/uL Final  . RDW-CV (Red Cell Distribution Widt* 04/06/2023 13.0  11.6 - 14.8 % Final  . MPV (Mean Platelet Volume) 04/06/2023 9.4  9.4 - 12.4 fl Final  . Neutrophils 04/06/2023 3.84  1.50 - 7.80 10^3/uL Final  . Lymphocytes 04/06/2023 0.71 (L)  1.00 - 3.60 10^3/uL Final  . Monocytes 04/06/2023 0.57  0.00 - 1.50 10^3/uL Final  . Eosinophils 04/06/2023 0.06  0.00 - 0.55 10^3/uL Final  . Basophils 04/06/2023 0.02  0.00 - 0.09 10^3/uL Final  . Neutrophil % 04/06/2023 73.7 (H)  32.0 - 70.0 % Final  . Lymphocyte % 04/06/2023 13.6  10.0 - 50.0 % Final  . Monocyte %  04/06/2023 10.9  4.0 - 13.0 % Final  . Eosinophil % 04/06/2023 1.2  1.0 - 5.0 % Final  . Basophil% 04/06/2023 0.4  0.0 - 2.0 % Final  . Immature Granulocyte % 04/06/2023 0.2  <=0.7 % Final  . Immature Granulocyte Count 04/06/2023 0.01  <=0.06 10^3/L Final  . Glucose 04/06/2023 99  70 - 110 mg/dL Final  . Sodium 88/92/7975 133 (L)  136 - 145 mmol/L Final  . Potassium 04/06/2023 5.0  3.6 - 5.1 mmol/L Final  . Chloride 04/06/2023 99  97 - 109 mmol/L Final  . Carbon Dioxide (CO2) 04/06/2023 25.0  22.0 - 32.0 mmol/L Final  . Urea Nitrogen (BUN) 04/06/2023 21  7 - 25 mg/dL Final  . Creatinine 88/92/7975 1.4 (H)  0.7 - 1.3 mg/dL Final  . Glomerular Filtration Rate (eGFR) 04/06/2023 50 (L)  >60 mL/min/1.73sq m Final  . Calcium 04/06/2023 9.4  8.7 - 10.3 mg/dL Final  . AST  88/92/7975 17  8 - 39 U/L Final  . ALT  04/06/2023 14  6 - 57 U/L Final  . Alk Phos (alkaline Phosphatase) 04/06/2023 61  34 - 104 U/L Final  . Albumin 04/06/2023 4.5  3.5 - 4.8 g/dL Final  . Bilirubin, Total 04/06/2023 0.3  0.3 - 1.2 mg/dL Final  . Protein, Total 04/06/2023 7.1  6.1 - 7.9 g/dL Final  . A/G Ratio 88/92/7975 1.7  1.0 - 5.0 gm/dL Final  . Vitamin B12 88/92/7975 945  >300 pg/mL Final  . Color 04/06/2023 Yellow  Colorless, Straw, Light Yellow, Yellow, Dark Yellow Final  . Clarity 04/06/2023 Clear  Clear Final  . Specific Gravity 04/06/2023 1.019  1.005 - 1.030 Final  . pH, Urine 04/06/2023 7.0  5.0 - 8.0 Final  . Protein, Urinalysis 04/06/2023 Negative  Negative mg/dL Final  . Glucose, Urinalysis 04/06/2023 Negative  Negative mg/dL Final  . Ketones, Urinalysis 04/06/2023 Negative  Negative mg/dL Final  . Blood, Urinalysis 04/06/2023 Negative  Negative Final  . Nitrite, Urinalysis 04/06/2023 Negative  Negative Final  . Leukocyte Esterase, Urinalysis 04/06/2023 Negative  Negative Final  . Bilirubin, Urinalysis 04/06/2023 Negative  Negative Final  . Urobilinogen, Urinalysis 04/06/2023 0.2  0.2 - 1.0 mg/dL Final   . WBC, UA 88/92/7975 <1  <=5 /hpf Final  . Red Blood Cells, Urinalysis 04/06/2023 0  <=3 /hpf Final  . Bacteria, Urinalysis 04/06/2023 0-5  0 - 5 /hpf Final  . Squamous Epithelial Cells, Urinaly* 04/06/2023 0  /hpf Final  Office Visit on 03/07/2023  Component Date Value Ref Range Status  . WBC (White Blood Cell Count) 03/07/2023 5.1  4.1 - 10.2 10^3/uL Final  . RBC (Red Blood Cell Count) 03/07/2023 3.93 (L)  4.69 - 6.13 10^6/uL Final  . Hemoglobin 03/07/2023 13.0 (L)  14.1 - 18.1 gm/dL Final  . Hematocrit 89/91/7975 38.0 (L)  40.0 - 52.0 % Final  . MCV (Mean Corpuscular Volume) 03/07/2023 96.7  80.0 - 100.0 fl Final  . MCH (Mean Corpuscular Hemoglobin) 03/07/2023 33.1 (H)  27.0 - 31.2 pg Final  . MCHC (Mean Corpuscular Hemoglobin * 03/07/2023 34.2  32.0 - 36.0 gm/dL Final  . Platelet Count 03/07/2023 174  150 - 450 10^3/uL Final  . RDW-CV (Red Cell Distribution Widt* 03/07/2023 12.6  11.6 - 14.8 % Final  . MPV (Mean Platelet Volume) 03/07/2023 9.3 (L)  9.4 - 12.4 fl Final  . Neutrophils 03/07/2023 3.88  1.50 - 7.80 10^3/uL Final  . Lymphocytes 03/07/2023 0.70 (L)  1.00 - 3.60 10^3/uL Final  . Monocytes 03/07/2023 0.47  0.00 - 1.50 10^3/uL Final  . Eosinophils 03/07/2023 0.05  0.00 - 0.55 10^3/uL Final  . Basophils 03/07/2023 0.02  0.00 - 0.09 10^3/uL Final  . Neutrophil % 03/07/2023 75.5 (H)  32.0 - 70.0 % Final  . Lymphocyte % 03/07/2023 13.6  10.0 - 50.0 % Final  . Monocyte % 03/07/2023 9.1  4.0 - 13.0 % Final  . Eosinophil % 03/07/2023 1.0  1.0 - 5.0 % Final  . Basophil% 03/07/2023 0.4  0.0 - 2.0 % Final  . Immature Granulocyte % 03/07/2023 0.4  <=0.7 % Final  . Immature Granulocyte Count 03/07/2023 0.02  <=0.06 10^3/L Final  . Glucose 03/07/2023 93  70 - 110 mg/dL Final  . Sodium 89/91/7975 134 (L)  136 - 145 mmol/L Final  . Potassium 03/07/2023 4.8  3.6 - 5.1 mmol/L Final  . Chloride 03/07/2023 98  97 - 109 mmol/L Final  . Carbon Dioxide (CO2) 03/07/2023 28.1  22.0 - 32.0  mmol/L Final  . Urea Nitrogen (BUN) 03/07/2023 18  7 - 25 mg/dL Final  . Creatinine 89/91/7975 1.2  0.7 - 1.3 mg/dL Final  . Glomerular Filtration Rate (eGFR)  03/07/2023 61  >60 mL/min/1.73sq m Final  . Calcium 03/07/2023 9.6  8.7 - 10.3 mg/dL Final  . AST  89/91/7975 17  8 - 39 U/L Final  . ALT  03/07/2023 12  6 - 57 U/L Final  . Alk Phos (alkaline Phosphatase) 03/07/2023 55  34 - 104 U/L Final  . Albumin 03/07/2023 4.5  3.5 - 4.8 g/dL Final  . Bilirubin, Total 03/07/2023 0.4  0.3 - 1.2 mg/dL Final  . Protein, Total 03/07/2023 6.9  6.1 - 7.9 g/dL Final  . A/G Ratio 89/91/7975 1.9  1.0 - 5.0 gm/dL Final  . Magnesium  03/07/2023 2.2  1.8 - 2.5 mg/dL Final  . Thyroid  Stimulating Hormone (TSH) 03/07/2023 1.149  0.450-5.330 uIU/ml uIU/mL Final  . Color 03/07/2023 Yellow  Colorless, Straw, Light Yellow, Yellow, Dark Yellow Final  . Clarity 03/07/2023 Clear  Clear Final  . Specific Gravity 03/07/2023 1.012  1.005 - 1.030 Final  . pH, Urine 03/07/2023 7.0  5.0 - 8.0 Final  . Protein, Urinalysis 03/07/2023 Negative  Negative mg/dL Final  . Glucose, Urinalysis 03/07/2023 Negative  Negative mg/dL Final  . Ketones, Urinalysis 03/07/2023 Negative  Negative mg/dL Final  . Blood, Urinalysis 03/07/2023 Negative  Negative Final  . Nitrite, Urinalysis 03/07/2023 Negative  Negative Final  . Leukocyte Esterase, Urinalysis 03/07/2023 Negative  Negative Final  . Bilirubin, Urinalysis 03/07/2023 Negative  Negative Final  . Urobilinogen, Urinalysis 03/07/2023 0.2  0.2 - 1.0 mg/dL Final  . WBC, UA 89/91/7975 <1  <=5 /hpf Final  . Red Blood Cells, Urinalysis 03/07/2023 <1  <=3 /hpf Final  . Bacteria, Urinalysis 03/07/2023 0-5  0 - 5 /hpf Final  . Squamous Epithelial Cells, Urinaly* 03/07/2023 0  /hpf Final  . Hemoglobin A1C 03/07/2023 6.0 (H)  4.2 - 5.6 % Final  . Average Blood Glucose (Calc) 03/07/2023 126  mg/dL Final  Office Visit on 01/06/2023  Component Date Value Ref Range Status  . Vent Rate (bpm)  01/06/2023 61   Final  . PR Interval (msec) 01/06/2023 146   Final  . QRS Interval (msec) 01/06/2023 82   Final  . QT Interval (msec) 01/06/2023 412   Final  . QTc (msec) 01/06/2023 414   Final   DIAGNOSIS: Annual physical exam  (primary encounter diagnosis)  Depression screening (Z13.31) Plan: Depression Screen -(PHQ- 2/9, BDI)  Benign essential hypertension  Acquired hypothyroidism  Prediabetes  Hyperlipemia, mixed  Gastroesophageal reflux disease with hiatal hernia  Stage 3 chronic kidney disease, unspecified whether stage 3a or 3b CKD (CMS/HHS-HCC)  Statin myopathy   PLAN: HTN- increase Toprol  to 37.5mg  po BID Thyroid  dz- same dose, labs today CKD- push fluids, labs today Prediabetes- diet/exercise/water, labs today HLD- diet/exercise/statin Proceed with surgery RTC 7 weeks, sooner if needed     Attestation Statement:   I personally performed the service. (TP)  Reyes JONETTA Costa, MD, MD

## 2024-01-10 ENCOUNTER — Other Ambulatory Visit: Payer: Self-pay

## 2024-01-10 ENCOUNTER — Encounter (HOSPITAL_COMMUNITY)
Admission: RE | Admit: 2024-01-10 | Discharge: 2024-01-10 | Disposition: A | Discharge status: Home / Self Care | Source: Ambulatory Visit | Attending: Neurosurgery | Admitting: Neurosurgery

## 2024-01-10 ENCOUNTER — Encounter (HOSPITAL_COMMUNITY): Payer: Self-pay

## 2024-01-10 VITALS — BP 159/61 | HR 66 | Temp 98.0°F | Resp 17 | Ht 66.5 in | Wt 171.6 lb

## 2024-01-10 DIAGNOSIS — I2510 Atherosclerotic heart disease of native coronary artery without angina pectoris: Secondary | ICD-10-CM | POA: Insufficient documentation

## 2024-01-10 DIAGNOSIS — Z01812 Encounter for preprocedural laboratory examination: Secondary | ICD-10-CM | POA: Insufficient documentation

## 2024-01-10 DIAGNOSIS — Z951 Presence of aortocoronary bypass graft: Secondary | ICD-10-CM | POA: Diagnosis not present

## 2024-01-10 DIAGNOSIS — I340 Nonrheumatic mitral (valve) insufficiency: Secondary | ICD-10-CM | POA: Insufficient documentation

## 2024-01-10 DIAGNOSIS — E039 Hypothyroidism, unspecified: Secondary | ICD-10-CM | POA: Insufficient documentation

## 2024-01-10 DIAGNOSIS — E871 Hypo-osmolality and hyponatremia: Secondary | ICD-10-CM | POA: Insufficient documentation

## 2024-01-10 DIAGNOSIS — Z01818 Encounter for other preprocedural examination: Secondary | ICD-10-CM

## 2024-01-10 DIAGNOSIS — N1832 Chronic kidney disease, stage 3b: Secondary | ICD-10-CM | POA: Diagnosis not present

## 2024-01-10 DIAGNOSIS — I129 Hypertensive chronic kidney disease with stage 1 through stage 4 chronic kidney disease, or unspecified chronic kidney disease: Secondary | ICD-10-CM | POA: Diagnosis not present

## 2024-01-10 LAB — TYPE AND SCREEN
ABO/RH(D): O POS
Antibody Screen: NEGATIVE

## 2024-01-10 LAB — SURGICAL PCR SCREEN
MRSA, PCR: NEGATIVE
Staphylococcus aureus: POSITIVE — AB

## 2024-01-10 NOTE — Progress Notes (Signed)
 PCP - Dr. Reyes D. Sparks MD Cardiologist - Dr. Cara Endow [facility] LOV 07-17-23  PPM/ICD - Denies Device Orders - n/a Rep Notified - n/a  Chest x-ray - N/A EKG - 07-17-23 - tracing is in the chart brought in from Dr. Bernita office Stress Test - 08-30-21 ECHO - 08-30-21 Cardiac Cath - 06-2000  Sleep Study - Denies CPAP - n/a  NON-diabetic  Last dose of GLP1 agonist-  Denies GLP1 instructions: n/a  Blood Thinner Instructions: Denies Aspirin  Instructions: Per patient stop 5 day's prior, last dose on 8-14/25  ERAS Protcol - NPO PRE-SURGERY Ensure or G2- none  COVID TEST- n/a   Anesthesia review: Yes, PONV, HTN, CABG, Renal Disorder, PVC's  Patient denies shortness of breath, fever, cough and chest pain at PAT appointment. Patient denies any respiratory issues at this time.    All instructions explained to the patient, with a verbal understanding of the material. Patient agrees to go over the instructions while at home for a better understanding. Patient also instructed to self quarantine after being tested for COVID-19. The opportunity to ask questions was provided.

## 2024-01-10 NOTE — Progress Notes (Signed)
 Surgical Instructions   Your procedure is scheduled on Wednesday, August 20th. Report to [facility] Main Entrance A at 8:30 A.M., then check in with the Admitting office. Any questions or running late day of surgery: call [phone]  Questions prior to your surgery date: call [phone], Monday-Friday, 8am-4pm. If you experience any cold or flu symptoms such as cough, fever, chills, shortness of breath, etc. between now and your scheduled surgery, please notify us  at the above number.     Remember:  Do not eat or drink after midnight the night before your surgery   Take these medicines the morning of surgery with A SIP OF WATER  amLODipine  (NORVASC )  doxazosin  (CARDURA )  esomeprazole (NEXIUM)  famotidine (PEPCID)  levothyroxine  (SYNTHROID )  metoprolol  succinate (TOPROL -XL)  traMADol  (ULTRAM )    May take these medicines IF NEEDED: acetaminophen  (TYLENOL )   Per your physician's instructions', stop taking your Aspirin  5 day's prior to surgery.  Last dose should be on Thursday, August 14th.  One week prior to surgery, STOP taking any Aleve, Naproxen, Ibuprofen, Motrin, Advil, Goody's, BC's, all herbal medications, fish oil, and non-prescription vitamins.                     Do NOT Smoke (Tobacco/Vaping) for 24 hours prior to your procedure.  If you use a CPAP at night, you may bring your mask/headgear for your overnight stay.   You will be asked to remove any contacts, glasses, piercing's, hearing aid's, dentures/partials prior to surgery. Please bring cases for these items if needed.    Patients discharged the day of surgery will not be allowed to drive home, and someone needs to stay with them for 24 hours.  SURGICAL WAITING ROOM VISITATION Patients may have no more than 2 support people in the waiting area - these visitors may rotate.   Pre-op nurse will coordinate an appropriate time for 1 ADULT support person, who may not rotate, to accompany patient in pre-op.   Children under the age of 69 must have an adult with them who is not the patient and must remain in the main waiting area with an adult.  If the patient needs to stay at the hospital during part of their recovery, the visitor guidelines for inpatient rooms apply.  Please refer to the [facility] website for the visitor guidelines for any additional information.   If you received a COVID test during your pre-op visit  it is requested that you wear a mask when out in public, stay away from anyone that may not be feeling well and notify your surgeon if you develop symptoms. If you have been in contact with anyone that has tested positive in the last 10 days please notify you surgeon.      Pre-operative 5 CHG Bathing Instructions   You can play a key role in reducing the risk of infection after surgery. Your skin needs to be as free of germs as possible. You can reduce the number of germs on your skin by washing with CHG (chlorhexidine  gluconate) soap before surgery. CHG is an antiseptic soap that kills germs and continues to kill germs even after washing.   DO NOT use if you have an allergy to chlorhexidine /CHG or antibacterial soaps. If your skin becomes reddened or irritated, stop using the CHG and notify one of our RNs at [phone].   Please shower with the CHG soap starting 4 days before surgery using the following schedule:     Please keep  in mind the following:  DO NOT shave, including legs and underarms, starting the day of your first shower.   You may shave your face at any point before/day of surgery.  Place clean sheets on your bed the day you start using CHG soap. Use a clean washcloth (not used since being washed) for each shower. DO NOT sleep with pets once you start using the CHG.   CHG Shower Instructions:  Wash your face and private area with normal soap. If you choose to wash your hair, wash first with your normal shampoo.  After you use shampoo/soap, rinse your hair  and body thoroughly to remove shampoo/soap residue.  Turn the water OFF and apply about 3 tablespoons (45 ml) of CHG soap to a CLEAN washcloth.  Apply CHG soap ONLY FROM YOUR NECK DOWN TO YOUR TOES (washing for 3-5 minutes)  DO NOT use CHG soap on face, private areas, open wounds, or sores.  Pay special attention to the area where your surgery is being performed.  If you are having back surgery, having someone wash your back for you may be helpful. Wait 2 minutes after CHG soap is applied, then you may rinse off the CHG soap.  Pat dry with a clean towel  Put on clean clothes/pajamas   If you choose to wear lotion, please use ONLY the CHG-compatible lotions that are listed below.  Additional instructions for the day of surgery: DO NOT APPLY any lotions, deodorants, cologne, or perfumes.   Do not bring valuables to the hospital. [facility] is not responsible for any belongings/valuables. Do not wear nail polish, gel polish, artificial nails, or any other type of covering on natural nails (fingers and toes) Do not wear jewelry or makeup Put on clean/comfortable clothes.  Please brush your teeth.  Ask your nurse before applying any prescription medications to the skin.     CHG Compatible Lotions   Aveeno Moisturizing lotion  Cetaphil Moisturizing Cream  Cetaphil Moisturizing Lotion  Clairol Herbal Essence Moisturizing Lotion, Dry Skin  Clairol Herbal Essence Moisturizing Lotion, Extra Dry Skin  Clairol Herbal Essence Moisturizing Lotion, Normal Skin  Curel Age Defying Therapeutic Moisturizing Lotion with Alpha Hydroxy  Curel Extreme Care Body Lotion  Curel Soothing Hands Moisturizing Hand Lotion  Curel Therapeutic Moisturizing Cream, Fragrance-Free  Curel Therapeutic Moisturizing Lotion, Fragrance-Free  Curel Therapeutic Moisturizing Lotion, Original Formula  Eucerin Daily Replenishing Lotion  Eucerin Dry Skin Therapy Plus Alpha Hydroxy Crme  Eucerin Dry Skin Therapy Plus Alpha  Hydroxy Lotion  Eucerin Original Crme  Eucerin Original Lotion  Eucerin Plus Crme Eucerin Plus Lotion  Eucerin TriLipid Replenishing Lotion  Keri Anti-Bacterial Hand Lotion  Keri Deep Conditioning Original Lotion Dry Skin Formula Softly Scented  Keri Deep Conditioning Original Lotion, Fragrance Free Sensitive Skin Formula  Keri Lotion Fast Absorbing Fragrance Free Sensitive Skin Formula  Keri Lotion Fast Absorbing Softly Scented Dry Skin Formula  Keri Original Lotion  Keri Skin Renewal Lotion Keri Silky Smooth Lotion  Keri Silky Smooth Sensitive Skin Lotion  Nivea Body Creamy Conditioning Oil  Nivea Body Extra Enriched Lotion  Nivea Body Original Lotion  Nivea Body Sheer Moisturizing Lotion Nivea Crme  Nivea Skin Firming Lotion  NutraDerm 30 Skin Lotion  NutraDerm Skin Lotion  NutraDerm Therapeutic Skin Cream  NutraDerm Therapeutic Skin Lotion  ProShield Protective Hand Cream  Provon moisturizing lotion  Please read over the following fact sheets that you were given.

## 2024-01-11 NOTE — Progress Notes (Signed)
 Anesthesia Chart Review:  83 yo male follows with cardiology at [facility] clinic for hx of CAD s/p CABG 2002, palpitations, moderate mitral insufficiency, HTN, HLD. Exercise nuclear stress test 08/2021 showed normal perfusion. Echo 08/2021 with normal biventricular function, mild to moderate MR. Last seen by Dr. Florencio 08/14/23, stable at that time, no changes to management.   Last seen by PCP Dr. Auston 01/05/24, stable at that time, upcoming surgery discussed.   Other pertinent hx includes CKD 3b, hypothyroid.  CBC 01/05/24 in care everywhere reviewed, mild anemia Hgb 12.7, otherwise unremarkable.  CMP 01/05/24 in care everywhere reviewed, mild hyponatremia sodium 134, creatinine elevated 1.4 c/w hx of CKD, otherwise unremarkable.   EKG 07/16/22: NSR. Rate 75.  Nuclear stress 08/30/21 (care everywhere): Normal treadmill EKG without evidence of ischemia or arrhythmia  Normal myocardial perfusion without evidence of myocardial ischemia   TTE 08/30/21 (care everywhere): INTERPRETATION  NORMAL LEFT VENTRICULAR SYSTOLIC FUNCTION  NORMAL RIGHT VENTRICULAR SYSTOLIC FUNCTION  NO VALVULAR STENOSIS  MILD to MODERATE MR  MILD TR, PR  EF 55-60%    Douglas Phelps, [place] [facility] Short Stay Center/Anesthesiology Phone [phone] 01/11/2024 12:51 PM

## 2024-01-11 NOTE — Anesthesia Preprocedure Evaluation (Signed)
 Anesthesia Evaluation  Patient identified by MRN, date of birth, ID band Patient awake    Reviewed: Allergy & Precautions, H&P , NPO status , Patient's Chart, lab work & pertinent test results, reviewed documented beta blocker date and time   History of Anesthesia Complications (+) PONV and history of anesthetic complications (did well in 2023 with gas and zofran /dex)  Airway Mallampati: III  TM Distance: >3 FB Neck ROM: Full    Dental  (+) Teeth Intact, Dental Advisory Given   Pulmonary  Snores at night    Pulmonary exam normal breath sounds clear to auscultation       Cardiovascular hypertension (171/62 preop, per pt normally 120s SBP), Pt. on medications and Pt. on home beta blockers + CABG (CABG 2002)  Normal cardiovascular exam+ Valvular Problems/Murmurs (mild-mod MR) MR  Rhythm:Regular Rate:Normal  follows with cardiology at [facility] clinic for hx of CAD s/p CABG 2002, palpitations, moderate mitral insufficiency, HTN, HLD. Exercise nuclear stress test 08/2021 showed normal perfusion. Echo 08/2021 with normal biventricular function, mild to moderate MR. Last seen by Dr. Florencio 08/14/23, stable at that time, no changes to management.    Neuro/Psych negative neurological ROS  negative psych ROS   GI/Hepatic Neg liver ROS, hiatal hernia,GERD  ,,  Endo/Other  Hypothyroidism    Renal/GU CRFRenal disease (CKD 3b)Cr 1.4  negative genitourinary   Musculoskeletal  (+) Arthritis , Osteoarthritis,  Tramadol  50mg  TID, took this AM   Abdominal   Peds negative pediatric ROS (+)  Hematology negative hematology ROS (+)   Anesthesia Other Findings   Reproductive/Obstetrics negative OB ROS                              Anesthesia Physical Anesthesia Plan  ASA: 3  Anesthesia Plan: General   Post-op Pain Management: Tylenol  PO (pre-op)*   Induction: Intravenous  PONV Risk Score and Plan:  Ondansetron , Dexamethasone  and Treatment may vary due to age or medical condition  Airway Management Planned: Oral ETT  Additional Equipment: ClearSight  Intra-op Plan:   Post-operative Plan: Extubation in OR  Informed Consent: I have reviewed the patients History and Physical, chart, labs and discussed the procedure including the risks, benefits and alternatives for the proposed anesthesia with the patient or authorized representative who has indicated his/her understanding and acceptance.     Dental advisory given  Plan Discussed with: CRNA  Anesthesia Plan Comments: (Prior fusion 2023, did well from PONV standpoint w/ inhalational anesthetic, zofran , dexamethasone . 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 Will avoid long acting medications.   Last airway note 2023: Ventilation: Mask ventilation without difficulty and Oral airway inserted - appropriate to patient size Laryngoscope Size: Miller and 2 Grade View: Grade II Tube type: Oral Tube size: 7.5 mm Number of attempts: 1)         Anesthesia Quick Evaluation

## 2024-01-17 ENCOUNTER — Encounter (HOSPITAL_COMMUNITY)
Admission: RE | Disposition: A | Discharge status: Home / Self Care | Payer: Self-pay | Source: Home / Self Care | Attending: Neurosurgery

## 2024-01-17 ENCOUNTER — Other Ambulatory Visit: Payer: Self-pay

## 2024-01-17 ENCOUNTER — Ambulatory Visit (HOSPITAL_COMMUNITY)
Admission: RE | Admit: 2024-01-17 | Discharge: 2024-01-18 | Disposition: A | Discharge status: Home / Self Care | Attending: Neurosurgery | Admitting: Neurosurgery

## 2024-01-17 ENCOUNTER — Ambulatory Visit (HOSPITAL_BASED_OUTPATIENT_CLINIC_OR_DEPARTMENT_OTHER)

## 2024-01-17 ENCOUNTER — Encounter (HOSPITAL_COMMUNITY): Payer: Self-pay | Admitting: Neurosurgery

## 2024-01-17 ENCOUNTER — Ambulatory Visit (HOSPITAL_COMMUNITY): Payer: Self-pay | Admitting: Physician Assistant

## 2024-01-17 ENCOUNTER — Ambulatory Visit (HOSPITAL_COMMUNITY)

## 2024-01-17 DIAGNOSIS — E039 Hypothyroidism, unspecified: Secondary | ICD-10-CM | POA: Diagnosis not present

## 2024-01-17 DIAGNOSIS — K219 Gastro-esophageal reflux disease without esophagitis: Secondary | ICD-10-CM | POA: Insufficient documentation

## 2024-01-17 DIAGNOSIS — N1832 Chronic kidney disease, stage 3b: Secondary | ICD-10-CM | POA: Insufficient documentation

## 2024-01-17 DIAGNOSIS — M5116 Intervertebral disc disorders with radiculopathy, lumbar region: Secondary | ICD-10-CM

## 2024-01-17 DIAGNOSIS — I129 Hypertensive chronic kidney disease with stage 1 through stage 4 chronic kidney disease, or unspecified chronic kidney disease: Secondary | ICD-10-CM

## 2024-01-17 DIAGNOSIS — Z981 Arthrodesis status: Secondary | ICD-10-CM | POA: Diagnosis not present

## 2024-01-17 DIAGNOSIS — M48062 Spinal stenosis, lumbar region with neurogenic claudication: Secondary | ICD-10-CM | POA: Insufficient documentation

## 2024-01-17 DIAGNOSIS — Z951 Presence of aortocoronary bypass graft: Secondary | ICD-10-CM | POA: Diagnosis not present

## 2024-01-17 DIAGNOSIS — Z8249 Family history of ischemic heart disease and other diseases of the circulatory system: Secondary | ICD-10-CM | POA: Diagnosis not present

## 2024-01-17 DIAGNOSIS — M51372 Other intervertebral disc degeneration, lumbosacral region with discogenic back pain and lower extremity pain: Secondary | ICD-10-CM | POA: Diagnosis not present

## 2024-01-17 DIAGNOSIS — K449 Diaphragmatic hernia without obstruction or gangrene: Secondary | ICD-10-CM | POA: Diagnosis not present

## 2024-01-17 DIAGNOSIS — M1990 Unspecified osteoarthritis, unspecified site: Secondary | ICD-10-CM | POA: Insufficient documentation

## 2024-01-17 DIAGNOSIS — M47816 Spondylosis without myelopathy or radiculopathy, lumbar region: Secondary | ICD-10-CM | POA: Diagnosis not present

## 2024-01-17 DIAGNOSIS — Z79899 Other long term (current) drug therapy: Secondary | ICD-10-CM | POA: Diagnosis not present

## 2024-01-17 SURGERY — POSTERIOR LUMBAR FUSION 1 LEVEL
Anesthesia: General

## 2024-01-17 MED ORDER — FENTANYL CITRATE (PF) 250 MCG/5ML IJ SOLN
INTRAMUSCULAR | Status: DC | PRN
  Administered 2024-01-17 (×5): 50 ug via INTRAVENOUS

## 2024-01-17 MED ORDER — NIACIN 500 MG PO TABS
500.0000 mg | ORAL_TABLET | Freq: Every day | ORAL | Status: DC
Start: 2024-01-17 — End: 2024-01-18
  Administered 2024-01-17: 500 mg via ORAL
  Filled 2024-01-17 (×3): qty 1

## 2024-01-17 MED ORDER — PHENYLEPHRINE 80 MCG/ML (10ML) SYRINGE FOR IV PUSH (FOR BLOOD PRESSURE SUPPORT)
PREFILLED_SYRINGE | INTRAVENOUS | Status: DC | PRN
  Administered 2024-01-17 (×3): 80 ug via INTRAVENOUS

## 2024-01-17 MED ORDER — PHENYLEPHRINE HCL-NACL 20-0.9 MG/250ML-% IV SOLN
INTRAVENOUS | Status: DC | PRN
  Administered 2024-01-17: 20 ug/min via INTRAVENOUS

## 2024-01-17 MED ORDER — CHLORHEXIDINE GLUCONATE 0.12 % MT SOLN
15.0000 mL | Freq: Once | OROMUCOSAL | Status: AC
  Administered 2024-01-17: 15 mL via OROMUCOSAL

## 2024-01-17 MED ORDER — BACITRACIN ZINC 500 UNIT/GM EX OINT
TOPICAL_OINTMENT | CUTANEOUS | Status: AC
Start: 2024-01-17 — End: 2024-01-17
  Filled 2024-01-17: qty 28.35

## 2024-01-17 MED ORDER — BUPIVACAINE-EPINEPHRINE (PF) 0.5% -1:200000 IJ SOLN
INTRAMUSCULAR | Status: DC | PRN
  Administered 2024-01-17: 10 mL

## 2024-01-17 MED ORDER — OXYCODONE-ACETAMINOPHEN 5-325 MG PO TABS
1.0000 | ORAL_TABLET | ORAL | Status: DC | PRN
  Administered 2024-01-17: 1 via ORAL
  Filled 2024-01-17: qty 1
  Filled 2024-01-17: qty 2

## 2024-01-17 MED ORDER — PHENOL 1.4 % MT LIQD: 1.0000 | OROMUCOSAL | Status: DC | PRN

## 2024-01-17 MED ORDER — CHLORHEXIDINE GLUCONATE CLOTH 2 % EX PADS: 6.0000 | MEDICATED_PAD | Freq: Once | CUTANEOUS | Status: DC

## 2024-01-17 MED ORDER — ONDANSETRON HCL 4 MG/2ML IJ SOLN
INTRAMUSCULAR | Status: AC
  Filled 2024-01-17: qty 2

## 2024-01-17 MED ORDER — PHENYLEPHRINE HCL-NACL 20-0.9 MG/250ML-% IV SOLN: INTRAVENOUS | Status: DC | PRN

## 2024-01-17 MED ORDER — ORAL CARE MOUTH RINSE: 15.0000 mL | Freq: Once | OROMUCOSAL | Status: AC

## 2024-01-17 MED ORDER — ONDANSETRON HCL 4 MG/2ML IJ SOLN: 4.0000 mg | Freq: Once | INTRAMUSCULAR | Status: DC | PRN

## 2024-01-17 MED ORDER — ROCURONIUM BROMIDE 10 MG/ML (PF) SYRINGE
PREFILLED_SYRINGE | INTRAVENOUS | Status: DC | PRN
  Administered 2024-01-17 (×2): 10 mg via INTRAVENOUS
  Administered 2024-01-17: 70 mg via INTRAVENOUS
  Administered 2024-01-17: 20 mg via INTRAVENOUS
  Administered 2024-01-17: 10 mg via INTRAVENOUS
  Administered 2024-01-17: 20 mg via INTRAVENOUS
  Administered 2024-01-17: 30 mg via INTRAVENOUS

## 2024-01-17 MED ORDER — DOXAZOSIN MESYLATE 4 MG PO TABS
4.0000 mg | ORAL_TABLET | Freq: Every day | ORAL | Status: DC
Start: 2024-01-17 — End: 2024-01-18
  Administered 2024-01-17: 4 mg via ORAL
  Filled 2024-01-17 (×2): qty 1

## 2024-01-17 MED ORDER — LIDOCAINE 2% (20 MG/ML) 5 ML SYRINGE
INTRAMUSCULAR | Status: AC
  Filled 2024-01-17: qty 5

## 2024-01-17 MED ORDER — THROMBIN 5000 UNITS EX KIT
PACK | CUTANEOUS | Status: AC
  Filled 2024-01-17: qty 1

## 2024-01-17 MED ORDER — BUPIVACAINE-EPINEPHRINE (PF) 0.5% -1:200000 IJ SOLN
INTRAMUSCULAR | Status: AC
  Filled 2024-01-17: qty 30

## 2024-01-17 MED ORDER — ADHERUS DURAL SEALANT
PACK | TOPICAL | Status: DC | PRN
Start: 2024-01-17 — End: 2024-01-17
  Administered 2024-01-17: 1 via TOPICAL

## 2024-01-17 MED ORDER — CYCLOBENZAPRINE HCL 5 MG PO TABS
5.0000 mg | ORAL_TABLET | Freq: Three times a day (TID) | ORAL | Status: DC | PRN
  Administered 2024-01-17: 5 mg via ORAL
  Filled 2024-01-17: qty 1

## 2024-01-17 MED ORDER — SODIUM CHLORIDE 0.9% FLUSH
3.0000 mL | INTRAVENOUS | Status: DC | PRN
Start: 2024-01-17 — End: 2024-01-18

## 2024-01-17 MED ORDER — LORATADINE 10 MG PO TABS
5.0000 mg | ORAL_TABLET | Freq: Every day | ORAL | Status: DC
  Administered 2024-01-17: 5 mg via ORAL
  Filled 2024-01-17: qty 1

## 2024-01-17 MED ORDER — 0.9 % SODIUM CHLORIDE (POUR BTL) OPTIME
TOPICAL | Status: DC | PRN
Start: 2024-01-17 — End: 2024-01-17
  Administered 2024-01-17: 1000 mL

## 2024-01-17 MED ORDER — ONDANSETRON HCL 4 MG PO TABS: 4.0000 mg | ORAL_TABLET | Freq: Four times a day (QID) | ORAL | Status: DC | PRN

## 2024-01-17 MED ORDER — PROPOFOL 10 MG/ML IV BOLUS
INTRAVENOUS | Status: DC | PRN
  Administered 2024-01-17: 130 mg via INTRAVENOUS

## 2024-01-17 MED ORDER — ONDANSETRON HCL 4 MG/2ML IJ SOLN: 4.0000 mg | Freq: Four times a day (QID) | INTRAMUSCULAR | Status: DC | PRN

## 2024-01-17 MED ORDER — CEFAZOLIN SODIUM-DEXTROSE 2-4 GM/100ML-% IV SOLN
2.0000 g | Freq: Three times a day (TID) | INTRAVENOUS | Status: AC
  Administered 2024-01-17 – 2024-01-18 (×2): 2 g via INTRAVENOUS
  Filled 2024-01-17 (×2): qty 100

## 2024-01-17 MED ORDER — DEXAMETHASONE SODIUM PHOSPHATE 10 MG/ML IJ SOLN
INTRAMUSCULAR | Status: DC | PRN
  Administered 2024-01-17: 10 mg via INTRAVENOUS

## 2024-01-17 MED ORDER — VASHE WOUND IRRIGATION OPTIME
TOPICAL | Status: DC | PRN
  Administered 2024-01-17: 34 [oz_av] via TOPICAL

## 2024-01-17 MED ORDER — THROMBIN 5000 UNITS EX SOLR
OROMUCOSAL | Status: DC | PRN
  Administered 2024-01-17 (×2): 5 mL via TOPICAL

## 2024-01-17 MED ORDER — HEMOSTATIC AGENTS (NO CHARGE) OPTIME
TOPICAL | Status: DC | PRN
Start: 2024-01-17 — End: 2024-01-17
  Administered 2024-01-17: 1 via TOPICAL

## 2024-01-17 MED ORDER — ROCURONIUM BROMIDE 10 MG/ML (PF) SYRINGE
PREFILLED_SYRINGE | INTRAVENOUS | Status: AC
Start: 2024-01-17 — End: 2024-01-17
  Filled 2024-01-17: qty 10

## 2024-01-17 MED ORDER — CHLORHEXIDINE GLUCONATE 0.12 % MT SOLN
OROMUCOSAL | Status: AC
  Filled 2024-01-17: qty 15

## 2024-01-17 MED ORDER — DEXAMETHASONE SODIUM PHOSPHATE 10 MG/ML IJ SOLN
INTRAMUSCULAR | Status: AC
  Filled 2024-01-17: qty 1

## 2024-01-17 MED ORDER — METOPROLOL SUCCINATE ER 25 MG PO TB24
37.5000 mg | ORAL_TABLET | Freq: Two times a day (BID) | ORAL | Status: DC
  Administered 2024-01-17: 37.5 mg via ORAL
  Filled 2024-01-17 (×3): qty 1

## 2024-01-17 MED ORDER — MORPHINE SULFATE (PF) 2 MG/ML IV SOLN: 2.0000 mg | INTRAVENOUS | Status: DC | PRN

## 2024-01-17 MED ORDER — CEFAZOLIN SODIUM-DEXTROSE 2-4 GM/100ML-% IV SOLN
2.0000 g | INTRAVENOUS | Status: DC
  Filled 2024-01-17: qty 100

## 2024-01-17 MED ORDER — SODIUM CHLORIDE 0.9 % IV SOLN
250.0000 mL | INTRAVENOUS | Status: DC
  Administered 2024-01-17: 250 mL via INTRAVENOUS

## 2024-01-17 MED ORDER — ACETAMINOPHEN 650 MG RE SUPP: 650.0000 mg | RECTAL | Status: DC | PRN

## 2024-01-17 MED ORDER — MENTHOL 3 MG MT LOZG: 1.0000 | LOZENGE | OROMUCOSAL | Status: DC | PRN

## 2024-01-17 MED ORDER — LACTATED RINGERS IV SOLN
INTRAVENOUS | Status: DC | PRN
Start: 2024-01-17 — End: 2024-01-17

## 2024-01-17 MED ORDER — ACETAMINOPHEN 325 MG PO TABS
650.0000 mg | ORAL_TABLET | ORAL | Status: DC | PRN
  Administered 2024-01-18 (×2): 650 mg via ORAL
  Filled 2024-01-17 (×2): qty 2

## 2024-01-17 MED ORDER — BUPIVACAINE LIPOSOME 1.3 % IJ SUSP
INTRAMUSCULAR | Status: AC
  Filled 2024-01-17: qty 20

## 2024-01-17 MED ORDER — SODIUM CHLORIDE 0.9% FLUSH
3.0000 mL | Freq: Two times a day (BID) | INTRAVENOUS | Status: DC
  Administered 2024-01-17: 3 mL via INTRAVENOUS

## 2024-01-17 MED ORDER — HYDROMORPHONE HCL 1 MG/ML IJ SOLN
INTRAMUSCULAR | Status: AC
  Filled 2024-01-17: qty 0.5

## 2024-01-17 MED ORDER — FENTANYL CITRATE (PF) 100 MCG/2ML IJ SOLN
INTRAMUSCULAR | Status: AC
  Filled 2024-01-17: qty 2

## 2024-01-17 MED ORDER — FENTANYL CITRATE (PF) 100 MCG/2ML IJ SOLN
25.0000 ug | INTRAMUSCULAR | Status: DC | PRN
  Administered 2024-01-17: 25 ug via INTRAVENOUS
  Administered 2024-01-17: 50 ug via INTRAVENOUS
  Administered 2024-01-17: 25 ug via INTRAVENOUS

## 2024-01-17 MED ORDER — PANTOPRAZOLE SODIUM 40 MG PO TBEC: 80.0000 mg | DELAYED_RELEASE_TABLET | Freq: Every day | ORAL | Status: DC

## 2024-01-17 MED ORDER — ACETAMINOPHEN 500 MG PO TABS
1000.0000 mg | ORAL_TABLET | Freq: Four times a day (QID) | ORAL | Status: DC
  Administered 2024-01-17 (×2): 1000 mg via ORAL
  Filled 2024-01-17 (×2): qty 2

## 2024-01-17 MED ORDER — LACTATED RINGERS IV SOLN: INTRAVENOUS | Status: DC

## 2024-01-17 MED ORDER — SUGAMMADEX SODIUM 200 MG/2ML IV SOLN
INTRAVENOUS | Status: DC | PRN
  Administered 2024-01-17: 200 mg via INTRAVENOUS

## 2024-01-17 MED ORDER — AMLODIPINE BESYLATE 5 MG PO TABS
5.0000 mg | ORAL_TABLET | Freq: Two times a day (BID) | ORAL | Status: DC
  Administered 2024-01-17: 5 mg via ORAL
  Filled 2024-01-17: qty 1

## 2024-01-17 MED ORDER — FENTANYL CITRATE (PF) 250 MCG/5ML IJ SOLN
INTRAMUSCULAR | Status: AC
  Filled 2024-01-17: qty 5

## 2024-01-17 MED ORDER — CEFAZOLIN SODIUM-DEXTROSE 2-3 GM-%(50ML) IV SOLR
INTRAVENOUS | Status: DC | PRN
  Administered 2024-01-17: 2 g via INTRAVENOUS

## 2024-01-17 MED ORDER — AMISULPRIDE (ANTIEMETIC) 5 MG/2ML IV SOLN
10.0000 mg | Freq: Once | INTRAVENOUS | Status: AC
  Administered 2024-01-17: 10 mg via INTRAVENOUS

## 2024-01-17 MED ORDER — LOSARTAN POTASSIUM 50 MG PO TABS
50.0000 mg | ORAL_TABLET | Freq: Every day | ORAL | Status: DC
  Administered 2024-01-17: 50 mg via ORAL
  Filled 2024-01-17: qty 1

## 2024-01-17 MED ORDER — PHENYLEPHRINE 80 MCG/ML (10ML) SYRINGE FOR IV PUSH (FOR BLOOD PRESSURE SUPPORT)
PREFILLED_SYRINGE | INTRAVENOUS | Status: AC
  Filled 2024-01-17: qty 10

## 2024-01-17 MED ORDER — HYDROMORPHONE HCL 1 MG/ML IJ SOLN
INTRAMUSCULAR | Status: DC | PRN
  Administered 2024-01-17: .25 mg via INTRAVENOUS

## 2024-01-17 MED ORDER — DOCUSATE SODIUM 100 MG PO CAPS
100.0000 mg | ORAL_CAPSULE | Freq: Two times a day (BID) | ORAL | Status: DC
  Administered 2024-01-17 – 2024-01-18 (×2): 100 mg via ORAL
  Filled 2024-01-17 (×2): qty 1

## 2024-01-17 MED ORDER — AMISULPRIDE (ANTIEMETIC) 5 MG/2ML IV SOLN
INTRAVENOUS | Status: AC
  Filled 2024-01-17: qty 4

## 2024-01-17 MED ORDER — LIDOCAINE 2% (20 MG/ML) 5 ML SYRINGE
INTRAMUSCULAR | Status: DC | PRN
  Administered 2024-01-17: 60 mg via INTRAVENOUS

## 2024-01-17 MED ORDER — BISACODYL 10 MG RE SUPP: 10.0000 mg | Freq: Every day | RECTAL | Status: DC | PRN

## 2024-01-17 MED ORDER — POLYETHYLENE GLYCOL 3350 17 G PO PACK
17.0000 g | PACK | Freq: Every day | ORAL | Status: DC | PRN
  Administered 2024-01-17: 17 g via ORAL
  Filled 2024-01-17: qty 1

## 2024-01-17 MED ORDER — LEVOTHYROXINE SODIUM 100 MCG PO TABS
100.0000 ug | ORAL_TABLET | Freq: Every day | ORAL | Status: DC
  Administered 2024-01-18: 100 ug via ORAL
  Filled 2024-01-17: qty 1

## 2024-01-17 MED ORDER — ONDANSETRON HCL 4 MG/2ML IJ SOLN
INTRAMUSCULAR | Status: DC | PRN
  Administered 2024-01-17: 4 mg via INTRAVENOUS

## 2024-01-17 SURGICAL SUPPLY — 64 items
BAG COUNTER SPONGE SURGICOUNT (BAG) ×1 IMPLANT
BASKET BONE COLLECTION (BASKET) ×1 IMPLANT
BENZOIN TINCTURE PRP APPL 2/3 (GAUZE/BANDAGES/DRESSINGS) ×1 IMPLANT
BIT DRILL NEURO 2X3.1 SFT TUCH (MISCELLANEOUS) IMPLANT
BLADE CLIPPER SURG (BLADE) IMPLANT
BUR MATCHSTICK NEURO 3.0 LAGG (BURR) ×1 IMPLANT
BUR PRECISION FLUTE 6.0 (BURR) ×1 IMPLANT
CANISTER SUCTION 3000ML PPV (SUCTIONS) ×1 IMPLANT
CAP LOCK DLX THRD (Cap) IMPLANT
CLEANSER WND VASHE INSTL 34OZ (WOUND CARE) ×1 IMPLANT
CNTNR URN SCR LID CUP LEK RST (MISCELLANEOUS) ×1 IMPLANT
COVER BACK TABLE 60X90IN (DRAPES) ×1 IMPLANT
DRAPE C-ARM 42X72 X-RAY (DRAPES) ×2 IMPLANT
DRAPE HALF SHEET 40X57 (DRAPES) ×1 IMPLANT
DRAPE LAPAROTOMY 100X72X124 (DRAPES) ×1 IMPLANT
DRAPE SURG 17X23 STRL (DRAPES) ×1 IMPLANT
DRSG OPSITE POSTOP 4X6 (GAUZE/BANDAGES/DRESSINGS) ×1 IMPLANT
DRSG TEGADERM 2-3/8X2-3/4 SM (GAUZE/BANDAGES/DRESSINGS) IMPLANT
ELECTRODE BLDE 4.0 EZ CLN MEGD (MISCELLANEOUS) ×1 IMPLANT
ELECTRODE REM PT RTRN 9FT ADLT (ELECTROSURGICAL) ×1 IMPLANT
EVACUATOR 1/8 PVC DRAIN (DRAIN) ×1 IMPLANT
GAUZE 4X4 16PLY ~~LOC~~+RFID DBL (SPONGE) ×1 IMPLANT
GLOVE BIO SURGEON STRL SZ 6 (GLOVE) ×1 IMPLANT
GLOVE BIO SURGEON STRL SZ8 (GLOVE) ×2 IMPLANT
GLOVE BIO SURGEON STRL SZ8.5 (GLOVE) ×2 IMPLANT
GLOVE BIOGEL PI IND STRL 6.5 (GLOVE) ×1 IMPLANT
GLOVE EXAM NITRILE XL STR (GLOVE) IMPLANT
GOWN STRL REUS W/ TWL LRG LVL3 (GOWN DISPOSABLE) ×1 IMPLANT
GOWN STRL REUS W/ TWL XL LVL3 (GOWN DISPOSABLE) ×2 IMPLANT
GOWN STRL REUS W/TWL 2XL LVL3 (GOWN DISPOSABLE) IMPLANT
HEMOSTAT POWDER KIT SURGIFOAM (HEMOSTASIS) ×1 IMPLANT
HEMOSTAT POWDER SURGIFOAM 1G (HEMOSTASIS) IMPLANT
KIT BASIN OR (CUSTOM PROCEDURE TRAY) ×1 IMPLANT
KIT GRAFTMAG DEL NEURO DISP (NEUROSURGERY SUPPLIES) IMPLANT
KIT POSITIONER JACKSON TABLE (MISCELLANEOUS) ×1 IMPLANT
KIT TURNOVER KIT B (KITS) ×1 IMPLANT
NDL HYPO 21X1.5 SAFETY (NEEDLE) ×1 IMPLANT
NDL HYPO 22X1.5 SAFETY MO (MISCELLANEOUS) ×1 IMPLANT
NEEDLE HYPO 21X1.5 SAFETY (NEEDLE) ×1 IMPLANT
NEEDLE HYPO 22X1.5 SAFETY MO (MISCELLANEOUS) ×1 IMPLANT
NS IRRIG 1000ML POUR BTL (IV SOLUTION) ×1 IMPLANT
PACK LAMINECTOMY NEURO (CUSTOM PROCEDURE TRAY) ×1 IMPLANT
PAD ARMBOARD POSITIONER FOAM (MISCELLANEOUS) ×3 IMPLANT
PATTIES SURGICAL .5 X1 (DISPOSABLE) IMPLANT
PUTTY DBM 10CC CALC GRAN (Putty) IMPLANT
ROD CURVED TI 6.35X70 (Rod) IMPLANT
SCREW PA DLX CREO 7.5X50 (Screw) IMPLANT
SEALANT ADHERUS EXTEND TIP (MISCELLANEOUS) IMPLANT
SPACER ALTERA 10X31 8-12MM-8 (Spacer) IMPLANT
SPIKE FLUID TRANSFER (MISCELLANEOUS) ×1 IMPLANT
SPONGE NEURO XRAY DETECT 1X3 (DISPOSABLE) IMPLANT
SPONGE SURGIFOAM ABS GEL 100 (HEMOSTASIS) IMPLANT
SPONGE SURGIFOAM ABS GEL SZ50 (HEMOSTASIS) IMPLANT
SPONGE T-LAP 4X18 ~~LOC~~+RFID (SPONGE) IMPLANT
STRIP CLOSURE SKIN 1/2X4 (GAUZE/BANDAGES/DRESSINGS) ×1 IMPLANT
SUT PROLENE 5 0 C1 (SUTURE) IMPLANT
SUT PROLENE 6 0 BV (SUTURE) IMPLANT
SUT VIC AB 1 CT1 18XBRD ANBCTR (SUTURE) ×2 IMPLANT
SUT VIC AB 2-0 CP2 18 (SUTURE) ×2 IMPLANT
SYR 20ML LL LF (SYRINGE) IMPLANT
TOWEL GREEN STERILE (TOWEL DISPOSABLE) ×1 IMPLANT
TOWEL GREEN STERILE FF (TOWEL DISPOSABLE) ×1 IMPLANT
TRAY FOLEY MTR SLVR 16FR STAT (SET/KITS/TRAYS/PACK) ×1 IMPLANT
WATER STERILE IRR 1000ML POUR (IV SOLUTION) ×1 IMPLANT

## 2024-01-17 NOTE — Plan of Care (Signed)

## 2024-01-17 NOTE — Progress Notes (Signed)
 Orthopedic Tech Progress Note Patient Details:  Douglas Phelps 08-Aug-1940 [id number]  Patient wife has back brace   Patient ID: Douglas Phelps, male   DOB: 01/27/1941, 83 y.o.   MRN: [id number]  Douglas Phelps Pac 01/17/2024, 5:43 PM

## 2024-01-17 NOTE — H&P (Signed)
 Subjective: The patient is an 83 year old white male on whom I performed an L4-5 fusion years ago.  He has done well but then developed a ruptured disc at L3-4.  He had L3-4 discectomy.  He has developed recurrent back and right greater left leg pain.  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  I discussed the various treatment options with him.  He has decided proceed with surgery.  Past Medical History:  Diagnosis Date   Actinic keratosis    Arthritis    Cancer (HCC) 2015   prostate   GERD (gastroesophageal reflux disease)    High cholesterol    History of hiatal hernia    Hx of CABG    2002 - triple bypass   Hypertension    Hypothyroidism    PONV (postoperative nausea and vomiting)    Premature ventricular contractions    Renal disorder    CKD stg 3   SI (sacroiliac) joint dysfunction    Thyroid  disease     Past Surgical History:  Procedure Laterality Date   APPENDECTOMY     CARDIAC SURGERY     CABG x 3  06/2000   CORONARY ARTERY BYPASS GRAFT     HERNIA REPAIR Right 2015   groin   PROSTATE SURGERY  10/2013    Allergies  Allergen Reactions   Benazepril Cough   Hctz [Hydrochlorothiazide] Other (See Comments)    Depleted electrolytes    Hydralazine Hcl Other (See Comments)    Kidney Disorder    Atorvastatin Other (See Comments)    Muscle pain   Fenofibrate     Muscle soreness   Repatha [Evolocumab]     Weakness, shakiness, abnormal dreams, GI Upset   Zetia [Ezetimibe]     Creased back and leg pain   Zocor [Simvastatin] Other (See Comments)    Muscle pain    Benadryl [Diphenhydramine] Anxiety   Omeprazole Other (See Comments)    headache    Social History   Tobacco Use   Smoking status: Never   Smokeless tobacco: Never  Substance Use Topics   Alcohol use: No    Family History  Problem Relation Age of Onset   Heart disease Mother    Emphysema Father    Prior to Admission medications   Medication Sig Start  Date End Date Taking? Authorizing Provider  acetaminophen  (TYLENOL ) 500 MG tablet Take 1,000 mg by mouth at bedtime.   Yes [provider]  amLODipine  (NORVASC ) 5 MG tablet Take 5 mg by mouth 2 (two) times daily.  02/15/16  Yes [provider]  Ascorbic Acid (VITAMIN C) 1000 MG tablet Take 1,000 mg by mouth daily.    Yes [provider]  aspirin  EC 81 MG tablet Take 81 mg by mouth daily.   Yes [provider]  Calcium Carbonate-Vitamin D (CALCIUM-VITAMIN D3 PO) Take 1 tablet by mouth in the morning and at bedtime.   Yes [provider]  Camphor-Menthol -Methyl Sal (SALONPAS) 3.06-04-08 % PTCH Place 1 patch onto the skin daily as needed (pain).   Yes [provider]  docusate sodium  (COLACE) 100 MG capsule Take 200 mg by mouth at bedtime.   Yes [provider]  doxazosin  (CARDURA ) 4 MG tablet Take 4 mg by mouth daily. 02/28/19 01/17/24 Yes [provider]  esomeprazole (NEXIUM) 40 MG capsule Take 40 mg by mouth every other day.  07/08/14  Yes [provider]  famotidine (PEPCID) 20 MG  tablet Take 20 mg by mouth See admin instructions. Take 20 mg by mouth twice a day, every other day.   Yes [provider]  guaiFENesin (MUCINEX) 600 MG 12 hr tablet Take 600 mg by mouth at bedtime as needed (congestion).   Yes [provider]  levothyroxine  (SYNTHROID ) 100 MCG tablet Take 100 mcg by mouth daily before breakfast. 10/06/14  Yes [provider]  loratadine  (CLARITIN ) 5 MG chewable tablet Chew 5 mg by mouth at bedtime.   Yes [provider]  losartan  (COZAAR ) 50 MG tablet Take 50 mg by mouth daily. 01/06/17 01/17/24 Yes [provider]  Magnesium  Oxide 500 MG TABS Take 500 mg by mouth daily.    Yes [provider]  metoprolol  succinate (TOPROL -XL) 25 MG 24 hr tablet Take 37.5 mg by mouth 2 (two) times daily. 06/11/12  Yes [provider]  Multiple Vitamin (MULTI-VITAMINS) TABS  Take 1 tablet by mouth daily.    Yes [provider]  niacin  (VITAMIN B3) 500 MG tablet Take 500 mg by mouth at bedtime.   Yes [provider]  polyethylene glycol (MIRALAX  / GLYCOLAX ) 17 g packet Take 17 g by mouth daily as needed for moderate constipation.   Yes [provider]  traMADol  (ULTRAM ) 50 MG tablet Take 50 mg by mouth in the morning, at noon, and at bedtime.   Yes [provider]  cyclobenzaprine  (FLEXERIL ) 5 MG tablet Take 1 tablet (5 mg total) by mouth 3 (three) times daily as needed for muscle spasms. Patient not taking: Reported on 01/08/2024 01/18/22   Mavis Purchase, MD  oxyCODONE -acetaminophen  (PERCOCET/ROXICET) 5-325 MG tablet Take 1-2 tablets by mouth every 4 (four) hours as needed for moderate pain. Patient not taking: Reported on 01/08/2024 01/18/22   Mavis Purchase, MD     Review of Systems  Positive ROS: As above  All other systems have been reviewed and were otherwise negative with the exception of those mentioned in the HPI and as above.  Objective: Vital signs in last 24 hours: Temp:  [98.2 F (36.8 C)] 98.2 F (36.8 C) (08/20 0841) Pulse Rate:  [68] 68 (08/20 0841) Resp:  [17] 17 (08/20 0841) BP: (171)/(62) 171/62 (08/20 0841) SpO2:  [97 %] 97 % (08/20 0841) Weight:  [74.8 kg] 74.8 kg (08/20 0841) Estimated body mass index is 26.23 kg/m as calculated from the following:   Height as of this encounter: 5' 6.5 (1.689 m).   Weight as of this encounter: 74.8 kg.   General Appearance: Alert Head: Normocephalic, without obvious abnormality, atraumatic Eyes: PERRL, conjunctiva/corneas clear, EOM's intact,    Ears: Normal  Throat: Normal  Neck: Supple, Back: His lumbar incision is well-healed.   Lungs: Clear to auscultation bilaterally, respirations unlabored Heart: Regular rate and rhythm, no murmur, rub or gallop Abdomen: Soft, non-tender Extremities: Extremities normal, atraumatic, no cyanosis or edema Skin:  unremarkable  NEUROLOGIC:   Mental status: alert and oriented,Motor Exam - grossly normal Sensory Exam - grossly normal Reflexes:  Coordination - grossly normal Gait - grossly normal Balance - grossly normal Cranial Nerves: I: smell Not tested  II: visual acuity  OS: Normal  OD: Normal   II: visual fields Full to confrontation  II: pupils Equal, round, reactive to light  III,VII: ptosis None  III,IV,VI: extraocular muscles  Full ROM  V: mastication Normal  V: facial light touch sensation  Normal  V,VII: corneal reflex  Present  VII: facial muscle function - upper  Normal  VII:  facial muscle function - lower Normal  VIII: hearing Not tested  IX: soft palate elevation  Normal  IX,X: gag reflex Present  XI: trapezius strength  5/5  XI: sternocleidomastoid strength 5/5  XI: neck flexion strength  5/5  XII: tongue strength  Normal    Data Review Lab Results  Component Value Date   WBC 5.3 12/28/2022   HGB 13.1 12/28/2022   HCT 37.6 (L) 12/28/2022   MCV 95.9 12/28/2022   PLT 170 12/28/2022   Lab Results  Component Value Date   NA 130 (L) 12/28/2022   K 4.1 12/28/2022   CL 99 12/28/2022   CO2 24 12/28/2022   BUN 22 12/28/2022   CREATININE 1.37 (H) 12/28/2022   GLUCOSE 112 (H) 12/28/2022   No results found for: INR, PROTIME  Assessment/Plan: Lumbar degenerative disease, recurrent lumbar herniated disc, lumbar radiculopathy, lumbago: I have discussed the situation with the patient and his wife.  I reviewed his imaging studies with him and pointed out the abnormalities.  We have discussed the various treatment options including surgery.  I described the surgical treatment option of an exploration of his lumbar fusion with L3-4 decompression, instrumentation and fusion.  I have shown him surgical models.  I have given him a surgical pamphlet.  We have discussed the risk, benefits, alternatives, expected postoperative course, and likelihood of achieving our goals with  surgery.  I have answered all the patient's questions.  He has decided proceed with surgery.   Douglas Phelps Budge 01/17/2024 10:58 AM

## 2024-01-17 NOTE — Transfer of Care (Signed)
 Immediate Anesthesia Transfer of Care Note  Patient: Douglas Phelps  Procedure(s) Performed: POSTERIOR FUSION LUMBAR THREE-LUMBAR FOUR  Patient Location: PACU  Anesthesia Type:General  Level of Consciousness: drowsy  Airway & Oxygen Therapy: Patient Spontanous Breathing and Patient connected to face mask oxygen  Post-op Assessment: Report given to RN and Post -op Vital signs reviewed and stable  Post vital signs: Reviewed  Last Vitals:  Vitals Value Taken Time  BP 111/93 01/17/24 16:03  Temp    Pulse 62 01/17/24 16:07  Resp 13 01/17/24 16:09  SpO2 99 % 01/17/24 16:07  Vitals shown include unfiled device data.  Last Pain:  Vitals:   01/17/24 0857  TempSrc:   PainSc: 4       Patients Stated Pain Goal: 0 (01/17/24 0857)  Complications: There were no known notable events for this encounter.

## 2024-01-17 NOTE — Op Note (Signed)
 Brief history: The patient is a 83 year old white male whose had previous back surgery.  He has developed recurrent back and right greater left leg pain consistent with lumbar radiculopathy/neurogenic claudication.  He has failed medical management and was worked up with a lumbar MRI which demonstrated a recurrent herniated disc and stenosis at L3-4.  I discussed the various treatment options with him.  He has decided to proceed with surgery.  Preoperative diagnosis: Recurrent herniated disc, degenerative disc disease, spinal stenosis compressing both the L3 and the L4 nerve roots; lumbago; lumbar radiculopathy; neurogenic claudication  Postoperative diagnosis: The same  Procedure: Redo L3-4 laminotomy/foraminotomies/medial facetectomy/discectomy to decompress the bilateral L3 and L4 nerve roots(the work required to do this was in addition to the work required to do the posterior lumbar interbody fusion because of the patient's recurrent lumbar herniated disc spinal stenosis, facet arthropathy. Etc. requiring a wide decompression of the nerve roots.); right L3-4 transforaminal lumbar interbody fusion with local morselized autograft bone and Zimmer DBM; insertion of interbody prosthesis at L3-4 (globus peek expandable interbody prosthesis); posterior segmental instrumentation from L3 to L5 with globus titanium pedicle screws and rods; posterior lateral arthrodesis at L3-4 with local morselized autograft bone and Zimmer DBM; exploration of lumbar fusion/removal of lumbar hardware.  Surgeon: Dr. Chyrl Budge  Asst.: Duwaine Beck, NP  Anesthesia: Gen. endotracheal  Estimated blood loss: 250 cc  Drains: Medium Hemovac in the epidural space  Complications: Durotomy  Description of procedure: The patient was brought to the operating room by the anesthesia team. General endotracheal anesthesia was induced. The patient was turned to the prone position on the Wilson frame. The patient's lumbosacral region  was then prepared with Betadine scrub and Betadine solution. Sterile drapes were applied.  I then injected the area to be incised with Marcaine  with epinephrine  solution. I then used the scalpel to make a linear midline incision over the L3-4 and L4-5 interspace, incising through the old surgical scar. I then used electrocautery to perform a bilateral subperiosteal dissection exposing the spinous process and lamina of L3-4 and L4-5, and to expose the old hardware at L4-5.  We then inserted the Verstrac retractor to provide exposure.  We explored the fusion by removing the caps from the old screws at L4-5 and then removed the rods.  I inspected the arthrodesis at L4-5.  It appeared solid.  I began the decompression by using the high speed drill to perform redo laminotomies at L3-4.  Weeks encountered epidural scar tissue at L3-4 on the left as expected.. We then used the Kerrison punches to widen the laminotomy and removed the epidural scar tissue and ligamentum flavum at L3-4 bilaterally. We used the Kerrison punches to remove the medial facets at L3-4 bilaterally, removed the right L3-4 facet. We performed wide foraminotomies about the bilateral L3 and L4 nerve roots completing the decompression.  I did create a durotomy at L3-4 on the left.  I closed it with interrupted 5-0 Prolene suture and at the end of case covered it with DuraSeal.  We now turned our attention to the posterior lumbar interbody fusion. I used a scalpel to incise the intervertebral disc at L3-4 bilaterally. I then performed a partial intervertebral discectomy at L3-4 bilaterally using the pituitary forceps.  We encountered large herniated disc at L3-4 bilaterally which we removed with the pituitary forceps.  We prepared the vertebral endplates at L3-4 bilaterally for the fusion by removing the soft tissues with the curettes. We then used the trial spacers to  pick the appropriate sized interbody prosthesis. We prefilled his prosthesis  with a combination of local morselized autograft bone that we obtained during the decompression as well as Zimmer DBM. We inserted the prefilled prosthesis into the interspace at L3-4 from the right, we then turned and expanded the prosthesis. There was a good snug fit of the prosthesis in the interspace. We then filled and the remainder of the intervertebral disc space with local morselized autograft bone and Zimmer DBM. This completed the posterior lumbar interbody arthrodesis.  During the decompression and insertion of the prosthesis the assistant protected the thecal sac and nerve roots with the D'Errico retractor.  We now turned attention to the instrumentation. Under fluoroscopic guidance we cannulated the bilateral L3 pedicles with the bone probe. We then removed the bone probe. We then tapped the pedicle with a 6.5 millimeter tap. We then removed the tap. We probed inside the tapped pedicle with a ball probe to rule out cortical breaches. We then inserted a 7.5 x 50 millimeter pedicle screw into the L3 pedicles bilaterally under fluoroscopic guidance. We then palpated along the medial aspect of the pedicles to rule out cortical breaches. There were none. The nerve roots were not injured. We then connected the unilateral pedicle screws with a lordotic rod. We compressed the construct and secured the rod in place with the caps. We then tightened the caps appropriately. This completed the instrumentation from L3-L5 bilaterally.  We now turned our attention to the posterior lateral arthrodesis at L3-4. We used the high-speed drill to decorticate the remainder of the facets, pars, transverse process at L3-4. We then applied a combination of local morselized autograft bone and Zimmer DBM over these decorticated posterior lateral structures. This completed the posterior lateral arthrodesis.  We then obtained hemostasis using bipolar electrocautery. We irrigated the wound out with vashe solution. We inspected  the thecal sac and nerve roots and noted they were well decompressed. We then removed the retractor.  We placed a medium Hemovac drain in the epidural space and tunneled it out through a separate stab wound.. We reapproximated patient's thoracolumbar fascia with interrupted #1 Vicryl suture. We reapproximated patient's subcutaneous tissue with interrupted 2-0 Vicryl suture. The reapproximated patient's skin with Steri-Strips and benzoin. The wound was then coated with bacitracin  ointment. A sterile dressing was applied. The drapes were removed. The patient was subsequently returned to the supine position where they were extubated by the anesthesia team. He was then transported to the post anesthesia care unit in stable condition. All sponge instrument and needle counts were reportedly correct at the end of this case.

## 2024-01-17 NOTE — Anesthesia Procedure Notes (Signed)
 Procedure Name: Intubation Date/Time: 01/17/2024 11:20 AM  Performed by: Jaree Trinka C, CRNAPre-anesthesia Checklist: Patient identified, Emergency Drugs available, Suction available and Patient being monitored Patient Re-evaluated:Patient Re-evaluated prior to induction Oxygen Delivery Method: Circle System Utilized Preoxygenation: Pre-oxygenation with 100% oxygen Induction Type: IV induction Ventilation: Mask ventilation without difficulty Laryngoscope Size: Mac and 4 Grade View: Grade II Tube type: Oral Tube size: 7.5 mm Number of attempts: 1 Airway Equipment and Method: Stylet Placement Confirmation: ETT inserted through vocal cords under direct vision, positive ETCO2 and breath sounds checked- equal and bilateral Secured at: 22 cm Tube secured with: Tape Dental Injury: Teeth and Oropharynx as per pre-operative assessment

## 2024-01-18 DIAGNOSIS — M5116 Intervertebral disc disorders with radiculopathy, lumbar region: Secondary | ICD-10-CM | POA: Diagnosis not present

## 2024-01-18 MED ORDER — PANTOPRAZOLE SODIUM 40 MG PO TBEC
80.0000 mg | DELAYED_RELEASE_TABLET | Freq: Every day | ORAL | Status: DC
  Administered 2024-01-18: 80 mg via ORAL
  Filled 2024-01-18: qty 2

## 2024-01-18 MED ORDER — DOCUSATE SODIUM 100 MG PO CAPS: 200.0000 mg | ORAL_CAPSULE | Freq: Every day | ORAL | 0 refills | Status: AC

## 2024-01-18 MED ORDER — OXYCODONE HCL 5 MG PO TABS: 5.0000 mg | ORAL_TABLET | ORAL | Status: DC | PRN

## 2024-01-18 MED ORDER — CYCLOBENZAPRINE HCL 5 MG PO TABS: 5.0000 mg | ORAL_TABLET | Freq: Three times a day (TID) | ORAL | 0 refills | Status: AC | PRN

## 2024-01-18 MED ORDER — OXYCODONE-ACETAMINOPHEN 5-325 MG PO TABS: 1.0000 | ORAL_TABLET | ORAL | 0 refills | Status: AC | PRN

## 2024-01-18 MED ORDER — MUPIROCIN 2 % EX OINT
TOPICAL_OINTMENT | Freq: Two times a day (BID) | CUTANEOUS | Status: DC
  Filled 2024-01-18: qty 22

## 2024-01-18 NOTE — Progress Notes (Signed)
 Patient alert and oriented, ambulate, void. Surgical site clean and dry no sign of infection. D/c instructions explain and given to the patient, all questions answered.d/c patient home per ordse.

## 2024-01-18 NOTE — Discharge Instructions (Signed)

## 2024-01-18 NOTE — Discharge Summary (Signed)
 Physician Discharge Summary     Providing Compassionate, Quality Care - Together   Patient ID: Douglas Phelps MRN: [id number] DOB/AGE: 1941/04/02 83 y.o.  Admit date: 01/17/2024 Discharge date: 01/18/2024  Admission Diagnoses: Lumbar spinal stenosis with neurogenic claudication  Discharge Diagnoses:  Principal Problem:   Spinal stenosis, lumbar region, with neurogenic claudication   Discharged Condition: good  Hospital Course: Patient underwent an L3-4 decompression and fusion with L3-4-5 posterior lateral arthrodesis by Dr. Mavis on 01/17/2024. He was admitted to 3C03 following recovery from anesthesia in the PACU. His postoperative course has been uncomplicated. He has worked with both physical and occupational therapies who feel the patient is ready for discharge home. He is ambulating independently and without difficulty. He is tolerating a normal diet. He is not having any bowel or bladder dysfunction. His pain is well-controlled with oral pain medication. He is ready for discharge home.   Consults: PT/TOC  Significant Diagnostic Studies: radiology: DG Lumbar Spine 2-3 Views Result Date: 01/17/2024 CLINICAL DATA:  Elective surgery. EXAM: LUMBAR SPINE - 2-3 VIEW COMPARISON:  Preoperative imaging. FINDINGS: 2 fluoroscopic spot views of the lumbar spine submitted from the operating room. Previous L4-L5 fusion is extended to the L3 level with new L3-L4 interbody spacer. Fluoroscopy time 22 seconds. Dose 17.71 mGy. IMPRESSION: Intraoperative fluoroscopy during lumbar fusion. Electronically Signed   By: Andrea Gasman M.D.   On: 01/17/2024 15:45   DG C-Arm 1-60 Min-No Report Result Date: 01/17/2024 Fluoroscopy was utilized by the requesting physician.  No radiographic interpretation.     Treatments: surgery: Redo L3-4 laminotomy/foraminotomies/medial facetectomy/discectomy to decompress the bilateral L3 and L4 nerve roots(the work required to do this was in addition to the work  required to do the posterior lumbar interbody fusion because of the patient's recurrent lumbar herniated disc spinal stenosis, facet arthropathy. Etc. requiring a wide decompression of the nerve roots.); right L3-4 transforaminal lumbar interbody fusion with local morselized autograft bone and Zimmer DBM; insertion of interbody prosthesis at L3-4 (globus peek expandable interbody prosthesis); posterior segmental instrumentation from L3 to L5 with globus titanium pedicle screws and rods; posterior lateral arthrodesis at L3-4 with local morselized autograft bone and Zimmer DBM; exploration of lumbar fusion/removal of lumbar hardware.   Discharge Exam: Blood pressure (!) 132/45, pulse 73, temperature 98.5 F (36.9 C), temperature source Oral, resp. rate 20, height 5' 6.5 (1.689 m), weight 74.8 kg, SpO2 98%.  Alert and oriented x 4 PERRLA CN II-XII grossly intact MAE, Strength and sensation intact Incision is covered with Honeycomb dressing and Steri Strips; Dressing is clean, dry, and intact   Disposition: Discharge disposition: 01-Home or Self Care       Discharge Instructions     Call MD for:  difficulty breathing, headache or visual disturbances   Complete by: As directed    Call MD for:  hives   Complete by: As directed    Call MD for:  persistant dizziness or light-headedness   Complete by: As directed    Call MD for:  persistant nausea and vomiting   Complete by: As directed    Call MD for:  redness, tenderness, or signs of infection (pain, swelling, redness, odor or green/yellow discharge around incision site)   Complete by: As directed    Call MD for:  severe uncontrolled pain   Complete by: As directed    Diet - low sodium heart healthy   Complete by: As directed    If the dressing is still on your incision site  when you go home, remove it on the third day after your surgery date. Remove dressing if it begins to fall off, or if it is dirty or damaged before the third day.    Complete by: As directed    Increase activity slowly   Complete by: As directed       Allergies as of 01/18/2024       Reactions   Benazepril Cough   Hctz [hydrochlorothiazide] Other (See Comments)   Depleted electrolytes    Hydralazine Hcl Other (See Comments)   Kidney Disorder   Atorvastatin Other (See Comments)   Muscle pain   Fenofibrate    Muscle soreness   Repatha [evolocumab]    Weakness, shakiness, abnormal dreams, GI Upset   Zetia [ezetimibe]    Creased back and leg pain   Zocor [simvastatin] Other (See Comments)   Muscle pain   Benadryl [diphenhydramine] Anxiety   Omeprazole Other (See Comments)   headache        Medication List     PAUSE taking these medications    traMADol  50 MG tablet Wait to take this until your doctor or other care provider tells you to start again. Commonly known as: ULTRAM  Take 50 mg by mouth in the morning, at noon, and at bedtime.       TAKE these medications    acetaminophen  500 MG tablet Commonly known as: TYLENOL  Take 1,000 mg by mouth at bedtime.   amLODipine  5 MG tablet Commonly known as: NORVASC  Take 5 mg by mouth 2 (two) times daily.   aspirin  EC 81 MG tablet Take 81 mg by mouth daily.   CALCIUM-VITAMIN D3 PO Take 1 tablet by mouth in the morning and at bedtime.   cyclobenzaprine  5 MG tablet Commonly known as: FLEXERIL  Take 1 tablet (5 mg total) by mouth 3 (three) times daily as needed for muscle spasms.   docusate sodium  100 MG capsule Commonly known as: COLACE Take 2 capsules (200 mg total) by mouth at bedtime.   doxazosin  4 MG tablet Commonly known as: CARDURA  Take 4 mg by mouth daily.   esomeprazole 40 MG capsule Commonly known as: NEXIUM Take 40 mg by mouth every other day.   famotidine 20 MG tablet Commonly known as: PEPCID Take 20 mg by mouth See admin instructions. Take 20 mg by mouth twice a day, every other day.   guaiFENesin 600 MG 12 hr tablet Commonly known as: MUCINEX Take 600 mg  by mouth at bedtime as needed (congestion).   levothyroxine  100 MCG tablet Commonly known as: SYNTHROID  Take 100 mcg by mouth daily before breakfast.   loratadine  5 MG chewable tablet Commonly known as: CLARITIN  Chew 5 mg by mouth at bedtime.   losartan  50 MG tablet Commonly known as: COZAAR  Take 50 mg by mouth daily.   Magnesium  Oxide -Mg Supplement 500 MG Tabs Take 500 mg by mouth daily.   metoprolol  succinate 25 MG 24 hr tablet Commonly known as: TOPROL -XL Take 37.5 mg by mouth 2 (two) times daily.   Multi-Vitamins Tabs Take 1 tablet by mouth daily.   niacin  500 MG tablet Commonly known as: VITAMIN B3 Take 500 mg by mouth at bedtime.   oxyCODONE -acetaminophen  5-325 MG tablet Commonly known as: PERCOCET/ROXICET Take 1-2 tablets by mouth every 4 (four) hours as needed for severe pain (pain score 7-10) (postoperative pain). What changed: reasons to take this   polyethylene glycol 17 g packet Commonly known as: MIRALAX  / GLYCOLAX  Take 17 g by mouth  daily as needed for moderate constipation.   Salonpas 3.06-04-08 % Ptch Generic drug: Camphor-Menthol -Methyl Sal Place 1 patch onto the skin daily as needed (pain).   vitamin C 1000 MG tablet Take 1,000 mg by mouth daily.               Discharge Care Instructions  (From admission, onward)           Start     Ordered   01/18/24 0000  If the dressing is still on your incision site when you go home, remove it on the third day after your surgery date. Remove dressing if it begins to fall off, or if it is dirty or damaged before the third day.        01/18/24 1049            Follow-up Information     Mavis Purchase, MD. Go on 01/18/2024.   Specialty: Neurosurgery Why: First postoperative appointment with x-rays is on 02/09/2024 at 8:15 AM. Contact information: [address] [place] [address] [phone]                 Signed: Gerard Beck, DNP, AGNP-C Nurse  Practitioner  [facility] [address], [place], [place] [address] P: [phone]    F: [phone]  01/18/2024, 10:50 AM

## 2024-01-18 NOTE — Evaluation (Signed)
 Physical Therapy Evaluation  Patient Details Name: Douglas Phelps MRN: [id number] DOB: Dec 15, 1940 Today's Date: 01/18/2024  History of Present Illness  Pt is an 83 y/o male who presents s/p L3-L4 PLIF on 01/17/2024. PMH significant for prostate CA, CABG, HTN, hypothyroidism, PVC's, SI joint dysfunction.  Clinical Impression  Pt admitted with above diagnosis. At the time of PT eval, pt was able to demonstrate transfers and ambulation with gross supervision for safety to mod I and RW for support. Pt was educated on precautions, brace application/wearing schedule, appropriate activity progression, and car transfer. Pt currently with functional limitations due to the deficits listed below (see PT Problem List). Pt will benefit from skilled PT to increase their independence and safety with mobility to allow discharge to the venue listed below.          If plan is discharge home, recommend the following: A little help with walking and/or transfers;A little help with bathing/dressing/bathroom;Assistance with cooking/housework;Assist for transportation;Help with stairs or ramp for entrance   Can travel by private vehicle        Equipment Recommendations None recommended by PT  Recommendations for Other Services       Functional Status Assessment Patient has had a recent decline in their functional status and demonstrates the ability to make significant improvements in function in a reasonable and predictable amount of time.     Precautions / Restrictions Precautions Precautions: Fall;Back Precaution Booklet Issued: Yes (comment) Recall of Precautions/Restrictions: Intact Precaution/Restrictions Comments: Reviewed handout and pt was cued for precautions during functional mobility. Required Braces or Orthoses: Spinal Brace Spinal Brace: Lumbar corset;Applied in sitting position Restrictions Weight Bearing Restrictions Per Provider Order: No      Mobility  Bed Mobility Overal bed  mobility: Modified Independent             General bed mobility comments: HOB flat and min use of rails required. No assist.    Transfers Overall transfer level: Modified independent Equipment used: Rolling walker (2 wheels)               General transfer comment: VC's for hand placement on seated surface for safety. No assist required.    Ambulation/Gait Ambulation/Gait assistance: Supervision Gait Distance (Feet): 500 Feet Assistive device: Rolling walker (2 wheels) Gait Pattern/deviations: Step-through pattern, Decreased stride length, Trunk flexed Gait velocity: Decreased Gait velocity interpretation: <1.31 ft/sec, indicative of household ambulator   General Gait Details: VC's for improved posture, closer walker proximity and forward gaze. No assist required - progressed to supervision level by end of gait training.  Stairs Stairs: Yes Stairs assistance: Contact guard assist Stair Management: One rail Right, Step to pattern, Alternating pattern, Forwards Number of Stairs: 10 General stair comments: Pt practiced with both rails and with R rail only. Pt able to negotiate a full flight of stairs without difficulty.  Wheelchair Mobility     Tilt Bed    Modified Rankin (Stroke Patients Only)       Balance Overall balance assessment: Needs assistance Sitting-balance support: Feet supported, No upper extremity supported Sitting balance-Leahy Scale: Fair     Standing balance support: No upper extremity supported, During functional activity Standing balance-Leahy Scale: Poor Standing balance comment: needs RW for ambulation but able to perform static standing EOB without assist or UE support                             Pertinent Vitals/Pain Pain Assessment Pain Assessment: 0-10  Pain Score: 3  Pain Location: Back Pain Descriptors / Indicators: Operative site guarding, Sore Pain Intervention(s): Limited activity within patient's tolerance,  Monitored during session, Repositioned    Home Living Family/patient expects to be discharged to:: Private residence Living Arrangements: Spouse/significant other Available Help at Discharge: Family;Available 24 hours/day Type of Home: House Home Access: Stairs to enter   Entrance Stairs-Number of Steps: 4 Alternate Level Stairs-Number of Steps: 14-16 Home Layout: Two level;Able to live on main level with bedroom/bathroom Home Equipment: Rolling Walker (2 wheels);Shower seat      Prior Function Prior Level of Function : Independent/Modified Independent             Mobility Comments: Intermittent RW use immediately prior to surgery due to pain and weakness. >2 weeks ago did not require an AD. ADLs Comments: Independent     Extremity/Trunk Assessment   Upper Extremity Assessment Upper Extremity Assessment: Overall WFL for tasks assessed    Lower Extremity Assessment Lower Extremity Assessment: Generalized weakness;RLE deficits/detail RLE Deficits / Details: Decreased strength and muscular endurance consistent with pre-op diagnosis    Cervical / Trunk Assessment Cervical / Trunk Assessment: Back Surgery  Communication   Communication Communication: No apparent difficulties    Cognition Arousal: Alert Behavior During Therapy: WFL for tasks assessed/performed   PT - Cognitive impairments: No apparent impairments                         Following commands: Intact       Cueing Cueing Techniques: Verbal cues, Gestural cues     General Comments      Exercises     Assessment/Plan    PT Assessment Patient needs continued PT services  PT Problem List Decreased strength;Decreased activity tolerance;Decreased balance;Decreased mobility;Decreased knowledge of use of DME;Decreased safety awareness;Decreased knowledge of precautions;Pain       PT Treatment Interventions DME instruction;Stair training;Gait training;Functional mobility training;Therapeutic  activities;Therapeutic exercise;Balance training;Patient/family education    PT Goals (Current goals can be found in the Care Plan section)  Acute Rehab PT Goals Patient Stated Goal: Home today PT Goal Formulation: With patient/family Time For Goal Achievement: 01/25/24 Potential to Achieve Goals: Good    Frequency Min 5X/week     Co-evaluation               AM-PAC PT 6 Clicks Mobility  Outcome Measure Help needed turning from your back to your side while in a flat bed without using bedrails?: None Help needed moving from lying on your back to sitting on the side of a flat bed without using bedrails?: None Help needed moving to and from a bed to a chair (including a wheelchair)?: A Little Help needed standing up from a chair using your arms (e.g., wheelchair or bedside chair)?: A Little Help needed to walk in hospital room?: A Little Help needed climbing 3-5 steps with a railing? : A Little 6 Click Score: 20    End of Session Equipment Utilized During Treatment: Gait belt;Back brace Activity Tolerance: Patient tolerated treatment well Patient left: in bed;with call bell/phone within reach;with family/visitor present Nurse Communication: Mobility status PT Visit Diagnosis: Unsteadiness on feet (R26.81);Pain Pain - part of body:  (back)    Time: 9099-9072 PT Time Calculation (min) (ACUTE ONLY): 27 min   Charges:   PT Evaluation $PT Eval Low Complexity: 1 Low PT Treatments $Gait Training: 8-22 mins PT General Charges $$ ACUTE PT VISIT: 1 Visit  Douglas Phelps, PT, DPT Acute Rehabilitation Services Secure Chat Preferred Office: [phone]   Douglas Phelps 01/18/2024, 10:00 AM

## 2024-01-19 MED FILL — Thrombin For Soln 5000 Unit: CUTANEOUS | Qty: 5000 | Status: AC

## 2024-01-19 NOTE — Anesthesia Postprocedure Evaluation (Signed)
 Anesthesia Post Note  Patient: Douglas Phelps  Procedure(s) Performed: POSTERIOR FUSION LUMBAR THREE-LUMBAR FOUR     Patient location during evaluation: PACU Anesthesia Type: General Level of consciousness: awake and alert Pain management: pain level controlled Vital Signs Assessment: post-procedure vital signs reviewed and stable Respiratory status: spontaneous breathing, nonlabored ventilation and respiratory function stable Cardiovascular status: blood pressure returned to baseline and stable Postop Assessment: no apparent nausea or vomiting Anesthetic complications: no   There were no known notable events for this encounter.  Last Vitals:  Vitals:   01/18/24 0406 01/18/24 0735  BP: (!) 117/50 (!) 132/45  Pulse: 64 73  Resp: 18 20  Temp: 37.4 C 36.9 C  SpO2: 99% 98%    Last Pain:  Vitals:   01/18/24 1125  TempSrc:   PainSc: 2                  Butler Levander Pinal

## 2024-01-23 ENCOUNTER — Other Ambulatory Visit
Admission: RE | Admit: 2024-01-23 | Discharge: 2024-01-23 | Disposition: A | Discharge status: Home / Self Care | Source: Ambulatory Visit | Attending: Physician Assistant | Admitting: Physician Assistant

## 2024-01-23 DIAGNOSIS — G2581 Restless legs syndrome: Secondary | ICD-10-CM | POA: Insufficient documentation

## 2024-01-23 DIAGNOSIS — G4700 Insomnia, unspecified: Secondary | ICD-10-CM | POA: Insufficient documentation

## 2024-01-23 LAB — COMPREHENSIVE METABOLIC PANEL WITH GFR
ALT: 16 U/L (ref 0–44)
AST: 23 U/L (ref 15–41)
Albumin: 3.8 g/dL (ref 3.5–5.0)
Alkaline Phosphatase: 40 U/L (ref 38–126)
Anion gap: 8 (ref 5–15)
BUN: 17 mg/dL (ref 8–23)
CO2: 26 mmol/L (ref 22–32)
Calcium: 9.2 mg/dL (ref 8.9–10.3)
Chloride: 99 mmol/L (ref 98–111)
Creatinine, Ser: 1.19 mg/dL (ref 0.61–1.24)
GFR, Estimated: >60 mL/min (ref >60)
Glucose, Bld: 135 mg/dL — ABNORMAL HIGH (ref 70–99)
Potassium: 4.7 mmol/L (ref 3.5–5.1)
Sodium: 133 mmol/L — ABNORMAL LOW (ref 135–145)
Total Bilirubin: 0.7 mg/dL (ref 0.0–1.2)
Total Protein: 6.6 g/dL (ref 6.5–8.1)

## 2024-01-25 MED FILL — Sodium Chloride IV Soln 0.9%: INTRAVENOUS | Qty: 2000 | Status: AC

## 2024-01-25 MED FILL — Heparin Sodium (Porcine) Inj 1000 Unit/ML: INTRAMUSCULAR | Qty: 30 | Status: AC

## 2024-02-05 ENCOUNTER — Encounter (HOSPITAL_COMMUNITY): Payer: Self-pay | Admitting: Neurosurgery

## 2024-02-12 ENCOUNTER — Encounter (HOSPITAL_COMMUNITY): Payer: Self-pay | Admitting: Neurosurgery

## 2024-04-30 ENCOUNTER — Ambulatory Visit: Admitting: Dermatology

## 2024-04-30 ENCOUNTER — Encounter: Payer: Self-pay | Admitting: Dermatology

## 2024-04-30 DIAGNOSIS — L578 Other skin changes due to chronic exposure to nonionizing radiation: Secondary | ICD-10-CM | POA: Diagnosis not present

## 2024-04-30 DIAGNOSIS — B079 Viral wart, unspecified: Secondary | ICD-10-CM

## 2024-04-30 DIAGNOSIS — D492 Neoplasm of unspecified behavior of bone, soft tissue, and skin: Secondary | ICD-10-CM

## 2024-04-30 DIAGNOSIS — W908XXA Exposure to other nonionizing radiation, initial encounter: Secondary | ICD-10-CM | POA: Diagnosis not present

## 2024-04-30 DIAGNOSIS — L821 Other seborrheic keratosis: Secondary | ICD-10-CM | POA: Diagnosis not present

## 2024-04-30 DIAGNOSIS — L820 Inflamed seborrheic keratosis: Secondary | ICD-10-CM | POA: Diagnosis not present

## 2024-04-30 DIAGNOSIS — L570 Actinic keratosis: Secondary | ICD-10-CM

## 2024-04-30 NOTE — Progress Notes (Unsigned)
 Follow-Up Visit   Subjective  ABEDNEGO YEATES is a 83 y.o. male who presents for the following: check spots R jaw 3-11m, irritated by shaving, scaly spots bil sideburn areas  The patient has spots, moles and lesions to be evaluated, some may be new or changing and the patient may have concern these could be cancer.  The following portions of the chart were reviewed this encounter and updated as appropriate: medications, allergies, medical history  Review of Systems:  No other skin or systemic complaints except as noted in HPI or Assessment and Plan.  Objective  Well appearing patient in no apparent distress; mood and affect are within normal limits.  A focused examination was performed of the following areas: face  Relevant exam findings are noted in the Assessment and Plan.  R mandible 0.6cm verrucous pap  scalp, face x 17 (17) Pink scaly macules face x 21 (21) Stuck on waxy paps with erythema  Assessment & Plan   NEOPLASM OF SKIN R mandible Epidermal / dermal shaving  Lesion diameter (cm):  0.6 Informed consent: discussed and consent obtained   Timeout: patient name, date of birth, surgical site, and procedure verified   Procedure prep:  Patient was prepped and draped in usual sterile fashion Prep type:  Isopropyl alcohol Anesthesia: the lesion was anesthetized in a standard fashion   Anesthetic:  1% lidocaine  w/ epinephrine  1-100,000 buffered w/ 8.4% NaHCO3 Instrument used: flexible razor blade   Hemostasis achieved with: pressure, aluminum chloride and electrodesiccation   Outcome: patient tolerated procedure well   Post-procedure details: sterile dressing applied and wound care instructions given   Dressing type: bandage and petrolatum    Destruction of lesion Complexity: extensive   Destruction method: electrodesiccation and curettage   Informed consent: discussed and consent obtained   Timeout:  patient name, date of birth, surgical site, and procedure  verified Procedure prep:  Patient was prepped and draped in usual sterile fashion Prep type:  Isopropyl alcohol Anesthesia: the lesion was anesthetized in a standard fashion   Anesthetic:  1% lidocaine  w/ epinephrine  1-100,000 buffered w/ 8.4% NaHCO3 Curettage performed in three different directions: Yes   Electrodesiccation performed over the curetted area: Yes   Hemostasis achieved with:  pressure, aluminum chloride and electrodesiccation Outcome: patient tolerated procedure well with no complications   Post-procedure details: sterile dressing applied and wound care instructions given   Dressing type: bandage and petrolatum    Specimen 1 - Surgical pathology Differential Diagnosis: Wart vs ISK r/o CA  Check Margins: yes 0.6cm verrucous pap AK (ACTINIC KERATOSIS) (17) scalp, face x 17 (17) Actinic keratoses are precancerous spots that appear secondary to cumulative UV radiation exposure/sun exposure over time. They are chronic with expected duration over 1 year. A portion of actinic keratoses will progress to squamous cell carcinoma of the skin. It is not possible to reliably predict which spots will progress to skin cancer and so treatment is recommended to prevent development of skin cancer.  Recommend daily broad spectrum sunscreen SPF 30+ to sun-exposed areas, reapply every 2 hours as needed.  Recommend staying in the shade or wearing long sleeves, sun glasses (UVA+UVB protection) and wide brim hats (4-inch brim around the entire circumference of the hat). Call for new or changing lesions. Destruction of lesion - scalp, face x 17 (17) Complexity: simple   Destruction method: cryotherapy   Informed consent: discussed and consent obtained   Timeout:  patient name, date of birth, surgical site, and procedure verified Lesion  destroyed using liquid nitrogen: Yes   Region frozen until ice ball extended beyond lesion: Yes   Outcome: patient tolerated procedure well with no complications    Post-procedure details: wound care instructions given    INFLAMED SEBORRHEIC KERATOSIS (21) face x 21 (21) Symptomatic, irritating, patient would like treated. Destruction of lesion - face x 21 (21) Complexity: simple   Destruction method: cryotherapy   Informed consent: discussed and consent obtained   Timeout:  patient name, date of birth, surgical site, and procedure verified Lesion destroyed using liquid nitrogen: Yes   Region frozen until ice ball extended beyond lesion: Yes   Outcome: patient tolerated procedure well with no complications   Post-procedure details: wound care instructions given     ACTINIC DAMAGE - chronic, secondary to cumulative UV radiation exposure/sun exposure over time - diffuse scaly erythematous macules with underlying dyspigmentation - Recommend daily broad spectrum sunscreen SPF 30+ to sun-exposed areas, reapply every 2 hours as needed.  - Recommend staying in the shade or wearing long sleeves, sun glasses (UVA+UVB protection) and wide brim hats (4-inch brim around the entire circumference of the hat). - Call for new or changing lesions.  SEBORRHEIC KERATOSIS - Stuck-on, waxy, tan-brown papules and/or plaques  - Benign-appearing - Discussed benign etiology and prognosis. - Observe - Call for any changes   Return for as scheduled for TBSE, Hx of AKs.  I, Grayce Saunas, RMA, am acting as scribe for Alm Rhyme, MD .   Documentation: I have reviewed the above documentation for accuracy and completeness, and I agree with the above.  Alm Rhyme, MD

## 2024-04-30 NOTE — Patient Instructions (Addendum)

## 2024-05-01 ENCOUNTER — Ambulatory Visit: Payer: Self-pay | Admitting: Dermatology

## 2024-05-01 LAB — SURGICAL PATHOLOGY

## 2024-05-02 NOTE — Telephone Encounter (Signed)
 Discussed biopsy results with patient

## 2024-05-02 NOTE — Telephone Encounter (Signed)
-----   Message from Douglas Phelps sent at 05/01/2024  5:39 PM EST ----- FINAL DIAGNOSIS        1. Skin, right mandible :       VERRUCA VULGARIS, IRRITATED, LIMITED MARGINS FREE   Benign Viral Wart Already treated May recur May need re-treatment if recurs ----- Message ----- From: Interface, Lab In Three [facility] One Sent: 05/01/2024   4:26 PM EST To: Douglas JAYSON Rhyme, MD

## 2024-05-09 NOTE — Assessment & Plan Note (Addendum)
 Currently using VED + penile constriction ring  - SHIM 19  We reviewed the basic tenets of erectile dysfunction management, including normal erectile physiology, common contributing factors, and the spectrum of therapeutic options. Conservative measures such as lifestyle modification, optimization of comorbid conditions, and avoidance of exacerbating medications were discussed. Pharmacologic options including PDE5 inhibitors, intracavernosal or intraurethral therapies, and vacuum erection devices were reviewed, as well as surgical approaches such as penile prosthesis implantation. All questions were answered.   - Will restart on-demand sildenafil , 100 mg as needed.  He may use in conjunction with VED and penile constriction ring

## 2024-05-09 NOTE — Progress Notes (Signed)
 05/13/2024 10:54 AM   Douglas Phelps Companion 07-Aug-1940 [id number]   HPI: 83 y.o. male here for initial evaluation of ED  Primary issue: maintaining an erection Libido: normal Relationship w/ partner: good, accompanied by wife today Rigidity: 80% SHIM: 19 (w/ VED and ring)  Prior therapies:  VED (currently using with penile ring) Prior medium dose sildenafil    Hx of prostate Ca   - s/p RALP in 2015 (Dr. Estelle, [facility])  - pT2 GS 4+3, - sm  - s/p salvage XRT in 2019  - undetectable PSAs through 2020  Some degree of urgency and rare UUI May correlate with intermittent constipation, is on MiraLAX   History of carotid artery stenosis, CKD, CAD s/p CABG, low back pain, hypertension, HLD, spinal stenosis with neurogenic claudication  Tobacco Use: Low Risk (05/13/2024)   Patient History    Smoking Tobacco Use: Never    Smokeless Tobacco Use: Never    Passive Exposure: Not on file        PMH: Past Medical History:  Diagnosis Date   Actinic keratosis    Arthritis    Cancer (HCC) 2015   prostate   GERD (gastroesophageal reflux disease)    High cholesterol    History of hiatal hernia    Hx of CABG    2002 - triple bypass   Hypertension    Hypothyroidism    PONV (postoperative nausea and vomiting)    Premature ventricular contractions    Renal disorder    CKD stg 3   SI (sacroiliac) joint dysfunction    Thyroid  disease     Surgical History: Past Surgical History:  Procedure Laterality Date   APPENDECTOMY     CARDIAC SURGERY     CABG x 3  06/2000   CORONARY ARTERY BYPASS GRAFT     HERNIA REPAIR Right 2015   groin   PROSTATE SURGERY  10/2013    Family History: Family History  Problem Relation Age of Onset   Heart disease Mother    Emphysema Father     Social History:  reports that he has never smoked. He has never used smokeless tobacco. He reports that he does not drink alcohol and does not use drugs.      Physical Exam: BP 134/67   Pulse 69   Ht 5'  6 (1.676 m)   Wt 170 lb (77.1 kg)   BMI 27.44 kg/m    Constitutional:  Alert and oriented, No acute distress. Cardiovascular: No clubbing, cyanosis, or edema. Respiratory: Normal respiratory effort, no increased work of breathing. GI: Nondistended Skin: No rashes, bruises or suspicious lesions. Neurologic: Grossly intact, no focal deficits, moving all 4 extremities. Psychiatric: Normal mood and affect.  Laboratory Data: Component Ref Range & Units 2 wk ago  Hemoglobin A1C 4.2 - 5.6 % 6.3 High    Component Ref Range & Units 8 mo ago  PSA (Prostate Specific Antigen), Total 0.10 - 4.00 ng/mL <0.01 Low      Pertinent Imaging: N/A    Assessment & Plan:    ED (erectile dysfunction) of organic origin Assessment & Plan: Currently using VED + penile constriction ring  - SHIM 19  We reviewed the basic tenets of erectile dysfunction management, including normal erectile physiology, common contributing factors, and the spectrum of therapeutic options. Conservative measures such as lifestyle modification, optimization of comorbid conditions, and avoidance of exacerbating medications were discussed. Pharmacologic options including PDE5 inhibitors, intracavernosal or intraurethral therapies, and vacuum erection devices were reviewed, as well as  surgical approaches such as penile prosthesis implantation. All questions were answered.   - Will restart on-demand sildenafil , 100 mg as needed.  He may use in conjunction with VED and penile constriction ring  Orders: -     Sildenafil  Citrate; Take 1 tablet (100 mg total) by mouth daily as needed for erectile dysfunction.  Dispense: 12 tablet; Refill: 3  Prostate cancer (HCC) Assessment & Plan: Intermediate-risk localized prostate Ca   - s/p RALP in 2015  - pT2 GS 4+3, - sm  - s/p salvage XRT in 2019  - undetectable PSAs through 2020  PSA <0.1 (Apr 2025)  - RTC on 1 year with surveillance PSA  Orders: -     PSA; Future       Penne Skye, MD 05/13/2024  [facility] [address], [address] [place], [place] [address] [phone]

## 2024-05-13 ENCOUNTER — Encounter: Payer: Self-pay | Admitting: Urology

## 2024-05-13 ENCOUNTER — Ambulatory Visit: Admitting: Urology

## 2024-05-13 VITALS — BP 134/67 | HR 69 | Ht 66.0 in | Wt 170.0 lb

## 2024-05-13 DIAGNOSIS — C61 Malignant neoplasm of prostate: Secondary | ICD-10-CM | POA: Diagnosis not present

## 2024-05-13 DIAGNOSIS — N529 Male erectile dysfunction, unspecified: Secondary | ICD-10-CM | POA: Diagnosis not present

## 2024-05-13 MED ORDER — SILDENAFIL CITRATE 100 MG PO TABS: 100.0000 mg | ORAL_TABLET | Freq: Every day | ORAL | 3 refills | Status: AC | PRN

## 2024-05-13 NOTE — Assessment & Plan Note (Addendum)
 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

## 2024-05-31 ENCOUNTER — Other Ambulatory Visit (HOSPITAL_COMMUNITY): Payer: Self-pay | Admitting: Student

## 2024-05-31 DIAGNOSIS — M533 Sacrococcygeal disorders, not elsewhere classified: Secondary | ICD-10-CM

## 2024-05-31 DIAGNOSIS — M48062 Spinal stenosis, lumbar region with neurogenic claudication: Secondary | ICD-10-CM

## 2024-06-03 ENCOUNTER — Ambulatory Visit

## 2024-06-03 ENCOUNTER — Ambulatory Visit
Admission: RE | Admit: 2024-06-03 | Discharge: 2024-06-03 | Disposition: A | Discharge status: Home / Self Care | Source: Ambulatory Visit | Attending: Student | Admitting: Student

## 2024-06-03 DIAGNOSIS — M48062 Spinal stenosis, lumbar region with neurogenic claudication: Secondary | ICD-10-CM | POA: Insufficient documentation

## 2024-06-03 DIAGNOSIS — M533 Sacrococcygeal disorders, not elsewhere classified: Secondary | ICD-10-CM | POA: Diagnosis present

## 2024-06-20 ENCOUNTER — Ambulatory Visit: Admitting: Dermatology

## 2024-08-06 ENCOUNTER — Encounter (INDEPENDENT_AMBULATORY_CARE_PROVIDER_SITE_OTHER)

## 2024-08-06 ENCOUNTER — Ambulatory Visit (INDEPENDENT_AMBULATORY_CARE_PROVIDER_SITE_OTHER): Admitting: Vascular Surgery

## 2024-09-18 ENCOUNTER — Ambulatory Visit: Admitting: Dermatology

## 2025-05-06 ENCOUNTER — Other Ambulatory Visit

## 2025-05-09 ENCOUNTER — Ambulatory Visit: Admitting: Urology
